# Patient Record
Sex: Male | Born: 1960
Health system: Southern US, Community
[De-identification: ages and names within clinical notes are randomized; demographics above are authoritative.]

---
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
## 1999-05-12 ENCOUNTER — Other Ambulatory Visit: Admission: RE | Admit: 1999-05-12 | Discharge: 1999-05-12 | Payer: Self-pay | Admitting: Urology

## 2009-07-18 ENCOUNTER — Ambulatory Visit: Payer: Self-pay | Admitting: Family Medicine

## 2009-07-19 ENCOUNTER — Encounter: Payer: Self-pay | Admitting: Family Medicine

## 2009-07-22 ENCOUNTER — Encounter: Payer: Self-pay | Admitting: Family Medicine

## 2009-07-22 LAB — CONVERTED CEMR LAB
HDL goal, serum: 40 mg/dL
LDL Goal: 160 mg/dL

## 2010-05-09 ENCOUNTER — Ambulatory Visit: Payer: Self-pay | Admitting: Family Medicine

## 2010-05-09 DIAGNOSIS — M5430 Sciatica, unspecified side: Secondary | ICD-10-CM

## 2010-05-15 ENCOUNTER — Encounter: Admission: RE | Admit: 2010-05-15 | Discharge: 2010-08-13 | Payer: Self-pay | Admitting: Family Medicine

## 2011-01-28 LAB — CONVERTED CEMR LAB
Cholesterol: 201 mg/dL
HDL: 46 mg/dL
LDL Cholesterol: 133 mg/dL
Triglyceride fasting, serum: 108 mg/dL

## 2011-01-30 NOTE — Assessment & Plan Note (Signed)
Summary: Piriformis Syndrome   Vital Signs:  Patient profile:   50 year old male Height:      69.5 inches Weight:      180 pounds BMI:     26.29 Pulse rate:   87 / minute BP sitting:   129 / 70  (left arm) Cuff size:   regular  Vitals Entered By: Kathlene November (May 09, 2010 8:25 AM) CC: left buttock and upper leg pain when goes from sitting to standing and lying for 3 months   Primary Care Provider:  Nani Gasser MD  CC:  left buttock and upper leg pain when goes from sitting to standing and lying for 3 months.  History of Present Illness: left buttock and upper leg pain when goes from sitting to standing and lying for 3 months.  Did get better for a short period of time but then started again about 3 weeks ago.  In mid-Dec was carrying some sound equipment and not sure if related but was heavy. Did have a short period where all his muscles felt sore but those got better.  Feels like an intense "muscle pain".  Hx of tight hamstrings.  No back pain. Sitting makes it worse. 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  Sitting in car for prolonged time worsens.  Better with ambulation.  Taking 400mg  of IBU in AM and PM.  Using Biofreeze.   No old injuries or surgery to this area.   Current Medications (verified): 1)  Aspirin 81 Mg Tbec (Aspirin) .... Take One Tablet By Mouth Once A Day  Allergies (verified): No Known Drug Allergies  Comments:  Nurse/Medical Assistant: The patient's medications and allergies were reviewed with the patient and were updated in the Medication and Allergy Lists. Kathlene November (May 09, 2010 8:26 AM)  Past History:  Past Surgical History: Last updated: 07/18/2009 Groin Hernia, right 1974 Vasectomy 1999 Tonsillectomy   Physical Exam  General:  Well-developed,well-nourished,in no acute distress; alert,appropriate and cooperative throughout examination Msk:  Decreased flexion secondary toe tight hamstrings. Normal extension, rotation right and left. and side bending.   Neg straight leg raise thought pain inthe buttock with theis. Hip, knee, and ankle strength 5/5 bilat. Pian with hip adduction Neurologic:  Patellar and achilles 1+ bilat.    Impression & Recommendations:  Problem # 1:  SCIATICA, ACUTE (ICD-724.3) Dsicussed likely piriformis syndrome.  Can use IBU 800mg  two times a day or can call in NSAID for him. H.O give on stretches and will refer to PT.  if not improving in 2-3 weeeks then rec f/u for further eval. I don't suspect any problems with his lumbar spine right now.   His updated medication list for this problem includes:    Aspirin 81 Mg Tbec (Aspirin) .Marland Kitchen... Take one tablet by mouth once a day    Diclofenac Sodium 75 Mg Tbec (Diclofenac sodium) .Marland Kitchen... Take 1 tablet by mouth two times a day as needed  Orders: Physical Therapy Referral (PT)  Complete Medication List: 1)  Aspirin 81 Mg Tbec (Aspirin) .... Take one tablet by mouth once a day 2)  Diclofenac Sodium 75 Mg Tbec (Diclofenac sodium) .... Take 1 tablet by mouth two times a day as needed  Patient Instructions: 1)  Can increase your Ibuprofen 800mg  two times a day for pain relief with food and water.  2)  We will refer you to physical therapy.   Prescriptions: DICLOFENAC SODIUM 75 MG TBEC (DICLOFENAC SODIUM) Take 1 tablet by mouth two times a day  as needed  #60 x 0   Entered and Authorized by:   Nani Gasser MD   Signed by:   Nani Gasser MD on 05/09/2010   Method used:   Electronically to        [facility] [phone]* (retail)       [address] [place], [place]  [address]       Ph: [phone] or [phone]       Fax: [phone]   RxID:   [id number]

## 2012-04-04 ENCOUNTER — Emergency Department
Admission: EM | Admit: 2012-04-04 | Discharge: 2012-04-04 | Disposition: A | Discharge status: Home / Self Care | Payer: BC Managed Care – PPO | Source: Home / Self Care | Attending: Emergency Medicine | Admitting: Emergency Medicine

## 2012-04-04 DIAGNOSIS — R197 Diarrhea, unspecified: Secondary | ICD-10-CM

## 2012-04-04 DIAGNOSIS — A09 Infectious gastroenteritis and colitis, unspecified: Secondary | ICD-10-CM

## 2012-04-04 DIAGNOSIS — A084 Viral intestinal infection, unspecified: Secondary | ICD-10-CM

## 2012-04-04 LAB — POCT CBC W AUTO DIFF (K'VILLE URGENT CARE)

## 2012-04-04 MED ORDER — IBUPROFEN 400 MG PO TABS
400.0000 mg | ORAL_TABLET | Freq: Once | ORAL | Status: AC
  Administered 2012-04-04: 400 mg via ORAL

## 2012-04-04 NOTE — ED Notes (Addendum)
Patient presents with fever and chills x 5 days. The first three days he had diarrhea which has resolved. Denies nausea, vomiting or sweats. He also complains of headaches and body aches. He has tried an OTC flu medication, ( homeopathic ) without relief of symptoms.

## 2012-04-04 NOTE — ED Provider Notes (Signed)
History     CSN: [id number]  Arrival date & time 04/04/12  1428   First MD Initiated Contact with Patient 04/04/12 1508      Chief Complaint  Patient presents with  . Fever    x 5 days  . Chills    x 5 days    (Consider location/radiation/quality/duration/timing/severity/associated sxs/prior treatment) Patient is a 51 y.o. male presenting with fever.  Fever Primary symptoms of the febrile illness include fever.   This patient presents with fever and chills for the last 5 days.  The first 3 days he had diarrhea which has since resolved.  He denies nausea vomiting.  He also complains of flulike symptoms including headache and body aches.  He has tried some over-the-counter flu medication without any relief.  His wife is a Engineer, civil (consulting) and is here with him.  They state that he was having a lot of diarrhea and they think he may be a little bit dehydrated from it.  He has not been very hungry over the last few days and has not eaten very much, however this morning he did drink some coffee and drinks a lot of fluids well.  He is feeling very weak and fatigued.  No known sick contacts however there are some people at his church who have a stomach virus.  History reviewed. No pertinent past medical history.  Past Surgical History  Procedure Date  . Hernia repair     as a child    Family History  Problem Relation Age of Onset  . Hypertension Father     History  Substance Use Topics  . Smoking status: Not on file  . Smokeless tobacco: Never Used  . Alcohol Use: No      Review of Systems  Constitutional: Positive for fever.  All other systems reviewed and are negative.    Allergies  Review of patient's allergies indicates no known allergies.  Home Medications   Current Outpatient Rx  Name Route Sig Dispense Refill  . IBUPROFEN 200 MG PO TABS Oral Take 200 mg by mouth every 6 (six) hours as needed.      BP 158/90  Pulse 112  Temp(Src) 98.7 F (37.1 C) (Oral)  Resp 16   Ht 5\' 9"  (1.753 m)  Wt 176 lb (79.833 kg)  BMI 25.99 kg/m2  SpO2 97%  Physical Exam  Nursing note and vitals reviewed. Constitutional: He is oriented to person, place, and time. He appears well-developed and well-nourished.  Non-toxic appearance. He appears distressed (fatigue).  HENT:  Head: Normocephalic and atraumatic.  Right Ear: Tympanic membrane, external ear and ear canal normal.  Left Ear: Tympanic membrane, external ear and ear canal normal.  Nose: Nose normal.  Mouth/Throat: Uvula is midline, oropharynx is clear and moist and mucous membranes are normal. Mucous membranes are not dry.  Eyes: No scleral icterus.  Neck: Normal range of motion and full passive range of motion without pain. Neck supple. No tracheal tenderness present. No rigidity.  Cardiovascular: Regular rhythm, normal heart sounds and normal pulses.  Tachycardia present.   Pulmonary/Chest: Effort normal and breath sounds normal. No accessory muscle usage. No respiratory distress. He has no decreased breath sounds. He has no wheezes. He has no rhonchi.  Abdominal: There is tenderness (Mild epigastric tenderness). There is no rigidity, no rebound, no guarding, no CVA tenderness, no tenderness at McBurney's point and negative Murphy's sign.  Neurological: He is alert and oriented to person, place, and time.  Skin: Skin is warm  and dry. No rash noted.  Psychiatric: He has a normal mood and affect. His speech is normal and behavior is normal. Thought content normal.    ED Course  Procedures (including critical care time)   Labs Reviewed  POCT INFLUENZA A/B - Normal   No results found.   1. Diarrhea   2. Viral gastroenteritis       MDM   A rapid flu test was done today in clinic and was negative.  A CBC was done which showed a white count of 10 with a differential of 15% lymphocytes, 5% monocytes, and granulocytes slightly high at 80%.  Hemoglobin is 15.2 and platelets are 189.  We decided that the best  treatment right now would be to replace her fluids and gave him an IV with normal saline and gave him 1 L of fluid.  After that was finished we continued it for another 500 cc.  I advised the wife to make sure that she is pushing fluids and a bland diet on him for the next few days.  It is possible that he may have to do a liquid diet for a few days and then gradually increase it.  This is likely a viral gastroenteritis.  The diarrhea has resolved but if it becomes a nuisance than Imodium may be appropriate.  ER precautions are given for worsening pain, fever, worsening symptoms, or oral intolerance.  Upon leaving the patient looks better but still has a headache.  Give him ibuprofen why he is here.  Marlaine Hind, MD 04/04/12 1650

## 2012-04-06 ENCOUNTER — Telehealth: Payer: Self-pay | Admitting: Family Medicine

## 2016-04-24 ENCOUNTER — Encounter: Payer: Self-pay | Admitting: Family Medicine

## 2016-04-24 ENCOUNTER — Ambulatory Visit (INDEPENDENT_AMBULATORY_CARE_PROVIDER_SITE_OTHER): Payer: BLUE CROSS/BLUE SHIELD | Admitting: Family Medicine

## 2016-04-24 VITALS — BP 127/68 | HR 87 | Ht 68.5 in | Wt 179.0 lb

## 2016-04-24 DIAGNOSIS — H9313 Tinnitus, bilateral: Secondary | ICD-10-CM | POA: Diagnosis not present

## 2016-04-24 DIAGNOSIS — Z114 Encounter for screening for human immunodeficiency virus [HIV]: Secondary | ICD-10-CM | POA: Diagnosis not present

## 2016-04-24 DIAGNOSIS — Z1159 Encounter for screening for other viral diseases: Secondary | ICD-10-CM | POA: Diagnosis not present

## 2016-04-24 DIAGNOSIS — Z0000 Encounter for general adult medical examination without abnormal findings: Secondary | ICD-10-CM

## 2016-04-24 DIAGNOSIS — H9319 Tinnitus, unspecified ear: Secondary | ICD-10-CM | POA: Insufficient documentation

## 2016-04-24 NOTE — Patient Instructions (Addendum)
Recommend colon cancer screening starting at age 54. Consider colonoscopy versus sigmoidoscopy versus: Card versus yearly stool card occult testing. For vaccinations recommendation would be for a tetanus with it. And pertussis vaccination. This is a combination vaccine and is recommended every 10 years. Shingles vaccine is also recommended for all patients over 50. The you need to check with your insurance as some won't improve it and let sure 60 or over. Consider seeing ENT for the ear ringing and abnormal ear exam today. There is a group ear locally call [facility]. New guidelines recommend regular moderate intensity exercise for at least 30 minutes 5 days per week.

## 2016-04-24 NOTE — Progress Notes (Addendum)
Subjective:    Patient ID: Norman Rivera, male    DOB: 1961/12/05, 55 y.o.   MRN: [id number]  HPI 55 year old male who is here today for complete physical exam. He currently takes no prescription medications.   He does complain of ear ringing. It's been going on for well over a year maybe even a couple of years. He says it's been very gradual. Though it's been a little bit worse over the last 2 weeks since walking and was son's room who was playing drums. 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 He denies any injury or trauma. He does get hearing tested yearly through work but hasn't had his test in almost 2 years.   Review of Systems  Constitutional: Negative for fever, diaphoresis and unexpected weight change.  HENT: Positive for tinnitus. Negative for hearing loss, rhinorrhea and sneezing.   Eyes: Negative for visual disturbance.  Respiratory: Negative for cough and wheezing.   Cardiovascular: Negative for chest pain and palpitations.  Gastrointestinal: Negative for nausea, vomiting, diarrhea and blood in stool.  Genitourinary: Negative for dysuria and discharge.  Musculoskeletal: Negative for myalgias and arthralgias.  Skin: Negative for rash.  Neurological: Negative for headaches.  Hematological: Negative for adenopathy.  Psychiatric/Behavioral: Negative for sleep disturbance and dysphoric mood. The patient is not nervous/anxious.     BP 127/68 mmHg  Pulse 87  Ht 5' 8.5" (1.74 m)  Wt 179 lb (81.194 kg)  BMI 26.82 kg/m2  SpO2 100%    No Known Allergies  No past medical history on file.  Past Surgical History  Procedure Laterality Date  . Hernia repair  1974    as a child    Social History   Social History  . Marital Status: Married    Spouse Name: [name]   . Number of Children: 3  . Years of Education: BS   Occupational History  . inventory Hydrologist metals    Social History Main Topics  . Smoking status: Never Smoker   . Smokeless tobacco: Never Used  . Alcohol Use: No  . Drug Use: No  . Sexual Activity:    Partners: Female   Other Topics Concern  . 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 Married to [place]. 2 adult sons.    Family History  Problem Relation Age of Onset  . Hypertension Father     Outpatient Encounter Prescriptions as of 04/24/2016  Medication Sig  . ibuprofen (ADVIL,MOTRIN) 200 MG tablet Take 200 mg by mouth every 6 (six) hours as needed.   No facility-administered encounter medications on file as of 04/24/2016.          Objective:   Physical Exam  Constitutional: He is oriented to person, place, and time. He appears well-developed and well-nourished.  HENT:  Head: Normocephalic and atraumatic.  Right Ear: External ear normal.  Left Ear: External ear normal.  Nose: Nose normal.  Mouth/Throat: Oropharynx is clear and moist.  Eyes: Conjunctivae and EOM are normal. Pupils are equal, round, and reactive to light.  Neck: Normal range of motion. Neck supple. No thyromegaly present.  Cardiovascular: Normal rate, regular rhythm, normal heart sounds and intact distal pulses.   Pulmonary/Chest: Effort normal and breath sounds normal.  Abdominal: Soft. Bowel sounds are normal. He exhibits no distension  and no mass. There is no tenderness. There is no rebound and no guarding.  Musculoskeletal: Normal range of motion.  Lymphadenopathy:    He has no cervical adenopathy.  Neurological: He is alert and oriented to person, place, and time. He has normal reflexes.  Skin: Skin is warm and dry.  Psychiatric: He has a normal mood and affect. His behavior is normal. Judgment and thought content normal.       Assessment & Plan:  CPE Keep up a regular exercise program and make sure you are eating a healthy diet Try to eat 4 servings of dairy a day, or if  you are lactose intolerant take a calcium with vitamin D daily.  Discussed need for tetanus vaccine.Declined today but will think about it. Discussed need for shingles vaccine. Declined today but will think about it. Discussed colon cancer screening options.   He says he wants to think about it.    Tinnitus - Discussed options. Because he does have slightly abnormal ear exam I recommend ENT referral for further evaluation. He says he wants to think about it.

## 2016-04-25 ENCOUNTER — Encounter: Payer: Self-pay | Admitting: Family Medicine

## 2016-04-25 LAB — LIPID PANEL
CHOL/HDL RATIO: 4 ratio (ref <=5.0)
Cholesterol: 181 mg/dL (ref 125–200)
HDL: 45 mg/dL (ref >=40)
LDL CALC: 111 mg/dL (ref <130)
TRIGLYCERIDES: 127 mg/dL (ref <150)
VLDL: 25 mg/dL (ref <30)

## 2016-04-25 LAB — COMPLETE METABOLIC PANEL WITH GFR
ALT: 17 U/L (ref 9–46)
AST: 13 U/L (ref 10–35)
Albumin: 4.3 g/dL (ref 3.6–5.1)
Alkaline Phosphatase: 93 U/L (ref 40–115)
BUN: 13 mg/dL (ref 7–25)
CHLORIDE: 102 mmol/L (ref 98–110)
CO2: 25 mmol/L (ref 20–31)
Calcium: 9.3 mg/dL (ref 8.6–10.3)
Creat: 0.91 mg/dL (ref 0.70–1.33)
GFR, Est African American: >89 mL/min (ref >=60)
GLUCOSE: 84 mg/dL (ref 65–99)
POTASSIUM: 4.2 mmol/L (ref 3.5–5.3)
SODIUM: 139 mmol/L (ref 135–146)
Total Bilirubin: 0.9 mg/dL (ref 0.2–1.2)
Total Protein: 6.9 g/dL (ref 6.1–8.1)

## 2016-04-25 NOTE — Addendum Note (Signed)
Addended by: Nani GasserMETHENEY, Aidenjames Heckmann D on: 04/25/2016 03:07 PM   Modules accepted: Level of Service, SmartSet

## 2016-04-26 LAB — HIV ANTIBODY (ROUTINE TESTING W REFLEX): HIV: NONREACTIVE

## 2016-04-26 LAB — HEPATITIS C ANTIBODY: HCV Ab: NEGATIVE

## 2017-06-24 ENCOUNTER — Encounter: Payer: Self-pay | Admitting: Family Medicine

## 2017-06-24 ENCOUNTER — Ambulatory Visit (INDEPENDENT_AMBULATORY_CARE_PROVIDER_SITE_OTHER): Payer: BLUE CROSS/BLUE SHIELD | Admitting: Family Medicine

## 2017-06-24 VITALS — BP 130/80 | HR 83 | Ht 68.5 in | Wt 176.0 lb

## 2017-06-24 DIAGNOSIS — T63441A Toxic effect of venom of bees, accidental (unintentional), initial encounter: Secondary | ICD-10-CM

## 2017-06-24 DIAGNOSIS — M25471 Effusion, right ankle: Secondary | ICD-10-CM

## 2017-06-24 DIAGNOSIS — R2231 Localized swelling, mass and lump, right upper limb: Secondary | ICD-10-CM

## 2017-06-24 MED ORDER — PREDNISONE 20 MG PO TABS: 40.0000 mg | ORAL_TABLET | Freq: Every day | ORAL | 0 refills | Status: DC

## 2017-06-24 NOTE — Patient Instructions (Addendum)
Anaphylactic Reaction An anaphylactic reaction (anaphylaxis) is a sudden allergic reaction that is very bad (severe). It also affects more than one part of the body. This condition can be life-threatening. If you have an anaphylactic reaction, you need to get medical help right away. You may need to stay in the hospital. Your doctor may teach you how to use an allergy kit (anaphylaxis kit) and how to give yourself an allergy shot (epinephrine injection). You can give yourself an allergy shot with what is commonly called an auto-injector "pen." Symptoms of an anaphylactic reaction may include:  A stuffy nose (nasal congestion).  Headache.  Tingling in your mouth.  A flushed face.  An itchy, red rash.  Swelling of your eyes, lips, face, or tongue.  Swelling of the back of your mouth and your throat.  Breathing loudly (wheezing).  A hoarse voice.  Itchy, red, swollen areas of skin (hives).  Dizziness or light-headedness.  Passing out (fainting).  Feeling worried or nervous (anxiety).  Feeling confused.  Pain in your belly (abdomen) or chest.  Trouble with breathing, talking, or swallowing.  A tight feeling in your chest or throat.  Fast or uneven heartbeats (palpitations).  Throwing up (vomiting).  Watery poop (diarrhea).  Follow these instructions at home: Safety  Always keep an auto-injector pen or your allergy kit with you. These could save your life. Use them as told by your doctor.  Do not drive until your doctor says that it is safe.  Make sure that you, the people who live with you, and your employer know: ? How to use your allergy kit. ? How to use an auto-injector pen to give you an allergy shot.  If you used your auto-injector pen: ? Get more medicine for it right away. This is important in case you have another reaction. ? Get help right away.  Wear a bracelet or necklace that says you have an allergy, if your doctor tells you to do this.  Learn  the signs of a very bad allergic reaction.  Work with your doctors to make a plan for what to do if you have a very bad allergic reaction. Being prepared is important. General instructions  Take over-the-counter and prescription medicines only as told by your doctor.  If you have itchy, red, swollen areas of skin or a rash: ? Use over-the-counter medicine (antihistamine) as told by your doctor. ? Put cold, wet cloths (cold compresses) on your skin. ? Take baths or showers in cool water. Avoid hot water.  If you had tests done, it is up to you to get your test results. Ask your doctor when your results will be ready.  Tell any doctors who care for you that you have an allergy.  Keep all follow-up visits as told by your doctor. This is important. How is this prevented?  Avoid things (allergens) that gave you a very bad allergic reaction before.  If you have a food allergy and you go to a restaurant, tell your server about your allergy. If you are not sure if your meal was made with food that you are allergic to, ask your server before you eat it. Contact a doctor if:  You have symptoms of an allergic reaction. You may notice them soon after being around whatever it is that you are allergic to. Symptoms may include: ? A rash. ? A headache. ? Sneezing or a runny nose. ? Swelling. ? Feeling sick to your stomach. ? Watery poop. Get help right   away if:  You had to use your auto-injector pen. You must go to the emergency room even if the medicine seems to be working.  You have any of these: ? A tight feeling in your chest or your throat. ? Loud breathing. ? Trouble with breathing. ? Itchy, red, swollen areas of skin. ? Red skin or itching all over your body. ? Swelling in your lips, tongue, or the back of your throat.  You have throwing up that gets very bad.  You have watery poop that gets very bad.  You pass out or feel like you might pass out. These symptoms may be an  emergency. Do not wait to see if the symptoms will go away. Use your auto-injector pen or allergy kit as you have been told. Get medical help right away. Call your local emergency services (911 in the [place] Do not drive yourself to the hospital. Summary  An anaphylactic reaction (anaphylaxis) is a sudden allergic reaction that is very bad (severe).  This condition can be life-threatening. If you have an anaphylactic reaction, you need to get medical help right away.  Your doctor may teach you how to use an allergy kit (anaphylaxis kit) and how to give yourself an allergy shot (epinephrine injection) with an auto-injector "pen."  Always keep an auto-injector pen or your allergy kit with you. These could save your life. Use them as told by your doctor.  If you had to use your auto-injector pen, you must go to the emergency room even if the medicine seems to be working. This information is not intended to replace advice given to you by your health care provider. Make sure you discuss any questions you have with your health care provider. Document Released: 06/04/2008 Document Revised: 08/10/2016 Document Reviewed: 08/10/2016 Elsevier Interactive Patient Education  2017 Elsevier Inc.  

## 2017-06-24 NOTE — Progress Notes (Signed)
CC: Multiple Bee Stings  Subjective:    Patient ID: Norman Rivera, male    DOB: [birth year], 56 y.o.   MRN: [id number]  HPI 56 yo male comes in today after being stung by yellow jacket yesterday. Stung on the right hand and on right ankle while mowing the grass.  Says a year or so ago was stung about 12 times and didn't have any significant reaction.  This time he says he started swelling in the local area of the sting and then the redness and swelling has spread up to his forearm almost up to the elbow. On the ankle it has spread up to about the mid tibia area. He does have significant swelling around the ankle. He has not had any fever she'll shortness of breath. No nasal congestion. No cough or voice hoarseness. He's been using some topical calamine lotion and taking oral Benadryl.   Review of Systems   BP 130/80   Pulse 83   Ht 5' 8.5" (1.74 m)   Wt 176 lb (79.8 kg)   BMI 26.37 kg/m     No Known Allergies  No past medical history on file.  Past Surgical History:  Procedure Laterality Date  . HERNIA REPAIR  1974   as a child    Social History   Social History  . Marital status: Married    Spouse name: [name]   . Number of children: 3  . Years of education: BS   Occupational History  . inventory Hydrologist metals   Social History Main Topics  . Smoking status: Never Smoker  . Smokeless tobacco: Never Used  . Alcohol use No  . Drug use: No  . Sexual activity: Yes    Partners: Female   Other Topics Concern  . 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 Married to [place]. 2 adult sons.    Family History  Problem Relation Age of Onset  . Hypertension Father     Outpatient Encounter Prescriptions as of 06/24/2017  Medication Sig  . ibuprofen (ADVIL,MOTRIN) 200 MG tablet Take 200 mg by mouth every 6 (six) hours as needed.  . predniSONE (DELTASONE) 20 MG tablet Take 2 tablets (40 mg total) by  mouth daily.   No facility-administered encounter medications on file as of 06/24/2017.           Objective:   Physical Exam  Constitutional: He is oriented to person, place, and time. He appears well-developed and well-nourished.  HENT:  Head: Normocephalic and atraumatic.  Cardiovascular: Normal rate, regular rhythm and normal heart sounds.   Pulmonary/Chest: Effort normal and breath sounds normal.  Neurological: He is alert and oriented to person, place, and time.  Skin: Skin is warm and dry.  She has significant pitting edema and swelling on the right hand along the fingers and dorsum of the hand. He has swelling almost up to his elbow. Increased warmth over the area as well. Right ankle and lower leg are swollen as well. Increased warmth and some mild erythema around the ankle.  Psychiatric: He has a normal mood and affect. His behavior is normal.          Assessment & Plan:  Insect Stings - Will give oral prednisone for 5 days to help with the swelling and discomfort. Recommend icing frequently over the next several days. If not improving them please let me know. At this point  he has had a very significant local reaction. Try to avoid a future stings if at all possible. I gave him a list of warning signs and symptoms for anaphylaxis and if any of these occur and consider giving him an EpiPen.

## 2017-09-06 ENCOUNTER — Ambulatory Visit (INDEPENDENT_AMBULATORY_CARE_PROVIDER_SITE_OTHER): Payer: BLUE CROSS/BLUE SHIELD | Admitting: Family Medicine

## 2017-09-06 ENCOUNTER — Encounter: Payer: Self-pay | Admitting: Family Medicine

## 2017-09-06 VITALS — BP 140/68 | HR 92 | Wt 172.0 lb

## 2017-09-06 DIAGNOSIS — R002 Palpitations: Secondary | ICD-10-CM | POA: Diagnosis not present

## 2017-09-06 NOTE — Patient Instructions (Signed)
I would recommend an echocardiogram and a heart monitor to "catch" the sensations and see if they are early beats or something else.

## 2017-09-06 NOTE — Progress Notes (Signed)
Subjective:    Patient ID: Norman Rivera, male    DOB: 1961-05-13, 56 y.o.   MRN: [id number]  HPI  56 yo male comes in complaining of fluttering in his chest. He feels it is stress related.  He says is been going for at least 6 weeks. He says it almost feels like a "sinking". It just lasts a few seconds. Or maybe even a skipped beat. He notices it happens more when he is feeling very stressed. He has not noticed it during exercise. He typically does yoga about twice a week. He says his job is very stressful right now. He does usually drink coffee in the mornings but says he drinks decaffeinated soda at lunch. He denies any recent change in caffeine intake. No other medications or decongestants. Father has a history of peripheral vascular disease but no coronary artery disease that is known.  Review of Systems  BP 140/68   Pulse 92   Wt 172 lb (78 kg)   SpO2 99%   BMI 25.77 kg/m     No Known Allergies  No past medical history on file.  Past Surgical History:  Procedure Laterality Date  . HERNIA REPAIR  1974   as a child    Social History   Social History  . Marital status: Married    Spouse name: [name]   . Number of children: 3  . Years of education: BS   Occupational History  . inventory Hydrologist metals   Social History Main Topics  . Smoking status: Never Smoker  . Smokeless tobacco: Never Used  . Alcohol use No  . Drug use: No  . Sexual activity: Yes    Partners: Female   Other Topics Concern  . 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 Married to [place]. 2 adult sons.    Family History  Problem Relation Age of Onset  . Hypertension Father   . Peripheral vascular disease Father     Outpatient Encounter Prescriptions as of 09/06/2017  Medication Sig  . [DISCONTINUED] ibuprofen (ADVIL,MOTRIN) 200 MG tablet Take 200 mg by mouth every 6 (six) hours as needed.  . [DISCONTINUED]  predniSONE (DELTASONE) 20 MG tablet Take 2 tablets (40 mg total) by mouth daily.   No facility-administered encounter medications on file as of 09/06/2017.          Objective:   Physical Exam  Constitutional: He is oriented to person, place, and time. He appears well-developed and well-nourished.  HENT:  Head: Normocephalic and atraumatic.  Neck: Neck supple. No thyromegaly present.  Cardiovascular: Normal rate, regular rhythm and normal heart sounds.   No carotid bruits or renal bruits.   Pulmonary/Chest: Effort normal and breath sounds normal.  Musculoskeletal: He exhibits no edema.  Lymphadenopathy:    He has no cervical adenopathy.  Neurological: He is alert and oriented to person, place, and time.  Skin: Skin is warm and dry.  Psychiatric: He has a normal mood and affect. His behavior is normal.        Assessment & Plan:  Palpitations- overall low risk for CAD.  No HTN, non smoker, no early fam hx.  Lipids borderline in the past. Gave reasuurance. Will eval for thyroid d/o, anemia, and electrolite imbalance.  Avoid caffein. Work on getting adequate sleep. Work on reducing stress.  Discussed additional work up with echo and heart monitor. He is worried about  his deductible but will discuss with his wife and get back with me.    EKG today shows rate of   , NSR with        .

## 2017-09-07 LAB — COMPLETE METABOLIC PANEL WITH GFR
AG Ratio: 2 (calc) (ref 1.0–2.5)
ALBUMIN MSPROF: 4.7 g/dL (ref 3.6–5.1)
ALT: 13 U/L (ref 9–46)
AST: 13 U/L (ref 10–35)
Alkaline phosphatase (APISO): 95 U/L (ref 40–115)
BUN: 11 mg/dL (ref 7–25)
CALCIUM: 9.7 mg/dL (ref 8.6–10.3)
CO2: 31 mmol/L (ref 20–32)
CREATININE: 0.89 mg/dL (ref 0.70–1.33)
Chloride: 100 mmol/L (ref 98–110)
GFR, EST AFRICAN AMERICAN: 112 mL/min/{1.73_m2} (ref >=60)
GFR, EST NON AFRICAN AMERICAN: 96 mL/min/{1.73_m2} (ref >=60)
GLOBULIN: 2.4 g/dL (ref 1.9–3.7)
Glucose, Bld: 79 mg/dL (ref 65–99)
Potassium: 4.3 mmol/L (ref 3.5–5.3)
SODIUM: 138 mmol/L (ref 135–146)
TOTAL PROTEIN: 7.1 g/dL (ref 6.1–8.1)
Total Bilirubin: 0.8 mg/dL (ref 0.2–1.2)

## 2017-09-07 LAB — CBC WITH DIFFERENTIAL/PLATELET
Basophils Absolute: 61 cells/uL (ref 0–200)
Basophils Relative: 0.9 %
EOS ABS: 82 {cells}/uL (ref 15–500)
Eosinophils Relative: 1.2 %
HCT: 45.9 % (ref 38.5–50.0)
Hemoglobin: 15.6 g/dL (ref 13.2–17.1)
Lymphs Abs: 1809 cells/uL (ref 850–3900)
MCH: 29.7 pg (ref 27.0–33.0)
MCHC: 34 g/dL (ref 32.0–36.0)
MCV: 87.3 fL (ref 80.0–100.0)
MONOS PCT: 7.2 %
MPV: 12 fL (ref 7.5–12.5)
Neutro Abs: 4359 cells/uL (ref 1500–7800)
Neutrophils Relative %: 64.1 %
PLATELETS: 229 10*3/uL (ref 140–400)
RBC: 5.26 10*6/uL (ref 4.20–5.80)
RDW: 12.8 % (ref 11.0–15.0)
TOTAL LYMPHOCYTE: 26.6 %
WBC mixed population: 490 cells/uL (ref 200–950)
WBC: 6.8 10*3/uL (ref 3.8–10.8)

## 2017-09-07 LAB — TSH: TSH: 2.75 m[IU]/L (ref 0.40–4.50)

## 2017-10-14 ENCOUNTER — Ambulatory Visit (INDEPENDENT_AMBULATORY_CARE_PROVIDER_SITE_OTHER): Payer: BLUE CROSS/BLUE SHIELD | Admitting: Family Medicine

## 2017-10-14 ENCOUNTER — Encounter: Payer: Self-pay | Admitting: Family Medicine

## 2017-10-14 VITALS — BP 117/65 | HR 69 | Ht 69.0 in | Wt 175.0 lb

## 2017-10-14 DIAGNOSIS — Z0000 Encounter for general adult medical examination without abnormal findings: Secondary | ICD-10-CM

## 2017-10-14 LAB — COMPLETE METABOLIC PANEL WITH GFR
AG RATIO: 2 (calc) (ref 1.0–2.5)
ALT: 13 U/L (ref 9–46)
AST: 15 U/L (ref 10–35)
Albumin: 4.7 g/dL (ref 3.6–5.1)
Alkaline phosphatase (APISO): 96 U/L (ref 40–115)
BILIRUBIN TOTAL: 1.4 mg/dL — AB (ref 0.2–1.2)
BUN: 10 mg/dL (ref 7–25)
CALCIUM: 9.6 mg/dL (ref 8.6–10.3)
CHLORIDE: 101 mmol/L (ref 98–110)
CO2: 30 mmol/L (ref 20–32)
Creat: 1.03 mg/dL (ref 0.70–1.33)
GFR, EST NON AFRICAN AMERICAN: 81 mL/min/{1.73_m2} (ref >=60)
GFR, Est African American: 94 mL/min/{1.73_m2} (ref >=60)
GLUCOSE: 89 mg/dL (ref 65–99)
Globulin: 2.3 g/dL (calc) (ref 1.9–3.7)
POTASSIUM: 4.2 mmol/L (ref 3.5–5.3)
Sodium: 137 mmol/L (ref 135–146)
TOTAL PROTEIN: 7 g/dL (ref 6.1–8.1)

## 2017-10-14 LAB — LIPID PANEL W/REFLEX DIRECT LDL
Cholesterol: 183 mg/dL (ref <200)
HDL: 52 mg/dL (ref >40)
LDL CHOLESTEROL (CALC): 114 mg/dL — AB
Non-HDL Cholesterol (Calc): 131 mg/dL (calc) — ABNORMAL HIGH (ref <130)
TRIGLYCERIDES: 77 mg/dL (ref <150)
Total CHOL/HDL Ratio: 3.5 (calc) (ref <5.0)

## 2017-10-14 LAB — PSA: PSA: 0.5 ng/mL (ref <=4.0)

## 2017-10-14 NOTE — Progress Notes (Signed)
Subjective:    Patient ID: Norman Rivera, male    DOB: 06-15-1961, 56 y.o.   MRN: [id number]  HPI 56 year old male is here today for complete physical exam. He was recently seen for palpitations about 6 weeks ago. Still having occasional palpitations but hasn't checked with his insurance yet to see if they will cover a cardiac monitor and possible echocardiogram. He says he will let me know.  Also c/o of bilat knee pain - he has been getting some medial knee pain. He's been doing yoga over the last 9 months and says occasionally if he steps outward with his knee has noticed some pain or discomfort. Doesn't really keep him from doing specific activities. He denies any swelling or redness over the joints. He has had some arthritis in his shoulder previously.  Review of Systems  Comprehensive review systems is negative.    BP 117/65   Pulse 69   Ht  (1.753 m)   Wt 175 lb (79.4 kg)   SpO2 100%   BMI 25.84 kg/m     No Known Allergies  No past medical history on file.  Past Surgical History:  Procedure Laterality Date  . HERNIA REPAIR  1974   as a child    Social History   Social History  . Marital status: Married    Spouse name: [name]   . Number of children: 3  . Years of education: BS   Occupational History  . inventory Hydrologist metals   Social History Main Topics  . Smoking status: Never Smoker  . Smokeless tobacco: Never Used  . Alcohol use No  . Drug use: No  . Sexual activity: Yes    Partners: Female   Other Topics Concern  . 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 Married to [place]. 2 adult sons.  Doing Hot Yoga 3 days per week.      Family History  Problem Relation Age of Onset  . Hypertension Father   . Peripheral vascular disease Father     No outpatient encounter prescriptions on file as of 10/14/2017.   No facility-administered encounter medications on file as of  10/14/2017.           Objective:   Physical Exam  Constitutional: He is oriented to person, place, and time. He appears well-developed and well-nourished.  HENT:  Head: Normocephalic and atraumatic.  Right Ear: External ear normal.  Left Ear: External ear normal.  Nose: Nose normal.  Mouth/Throat: Oropharynx is clear and moist.  Eyes: Pupils are equal, round, and reactive to light. Conjunctivae and EOM are normal.  Neck: Normal range of motion. Neck supple. No thyromegaly present.  Cardiovascular: Normal rate, regular rhythm, normal heart sounds and intact distal pulses.   Pulmonary/Chest: Effort normal and breath sounds normal.  Abdominal: Soft. Bowel sounds are normal. He exhibits no distension and no mass. There is no tenderness. There is no rebound and no guarding.  Musculoskeletal: Normal range of motion.  Knee with normal flexion and extension. Tender over the medial joint lines bilaterally.  No swelling or edema.    Lymphadenopathy:    He has no cervical adenopathy.  Neurological: He is alert and oriented to person, place, and time. He has normal reflexes.  Skin: Skin is warm and dry.  Psychiatric: He has a normal mood and affect. His behavior is normal. Judgment and thought content normal.  Assessment & Plan:  CPE Keep up a regular exercise program and make sure you are eating a healthy diet Try to eat 4 servings of dairy a day, or if you are lactose intolerant take a calcium with vitamin D daily.  Your vaccines are up to date.   Bilat knee pain - Likely osteoarthritis versus cartilage damage. At this point just encourage him to make sure he is watching the position of his knee in the graft going past his toes when he is exercising. If it gets worse or more bothersome or is interfering with activities are keeping him from exercising and consider further workup including knee x-rays.  Discussed Colon Ca screening and advanced directives.

## 2017-10-14 NOTE — Patient Instructions (Addendum)
Check with your insurance on the coverage on colonoscopy.      Health Maintenance, Male A healthy lifestyle and preventive care is important for your health and wellness. Ask your health care provider about what schedule of regular examinations is right for you. What should I know about weight and diet? Eat a Healthy Diet  Eat plenty of vegetables, fruits, whole grains, low-fat dairy products, and lean protein.  Do not eat a lot of foods high in solid fats, added sugars, or salt.  Maintain a Healthy Weight Regular exercise can help you achieve or maintain a healthy weight. You should:  Do at least 150 minutes of exercise each week. The exercise should increase your heart rate and make you sweat (moderate-intensity exercise).  Do strength-training exercises at least twice a week.  Watch Your Levels of Cholesterol and Blood Lipids  Have your blood tested for lipids and cholesterol every 5 years starting at 55 years of age. If you are at high risk for heart disease, you should start having your blood tested when you are 55 years old. You may need to have your cholesterol levels checked more often if: ? Your lipid or cholesterol levels are high. ? You are older than 55 years of age. ? You are at high risk for heart disease.  What should I know about cancer screening? Many types of cancers can be detected early and may often be prevented. Lung Cancer  You should be screened every year for lung cancer if: ? You are a current smoker who has smoked for at least 30 years. ? You are a former smoker who has quit within the past 15 years.  Talk to your health care provider about your screening options, when you should start screening, and how often you should be screened.  Colorectal Cancer  Routine colorectal cancer screening usually begins at 55 years of age and should be repeated every 5-10 years until you are 55 years old. You may need to be screened more often if early forms of  precancerous polyps or small growths are found. Your health care provider may recommend screening at an earlier age if you have risk factors for colon cancer.  Your health care provider may recommend using home test kits to check for hidden blood in the stool.  A small camera at the end of a tube can be used to examine your colon (sigmoidoscopy or colonoscopy). This checks for the earliest forms of colorectal cancer.  Prostate and Testicular Cancer  Depending on your age and overall health, your health care provider may do certain tests to screen for prostate and testicular cancer.  Talk to your health care provider about any symptoms or concerns you have about testicular or prostate cancer.  Skin Cancer  Check your skin from head to toe regularly.  Tell your health care provider about any new moles or changes in moles, especially if: ? There is a change in a mole's size, shape, or color. ? You have a mole that is larger than a pencil eraser.  Always use sunscreen. Apply sunscreen liberally and repeat throughout the day.  Protect yourself by wearing long sleeves, pants, a wide-brimmed hat, and sunglasses when outside.  What should I know about heart disease, diabetes, and high blood pressure?  If you are 107-25 years of age, have your blood pressure checked every 3-5 years. If you are 54 years of age or older, have your blood pressure checked every year. You should have your blood  pressure measured twice-once when you are at a hospital or clinic, and once when you are not at a hospital or clinic. Record the average of the two measurements. To check your blood pressure when you are not at a hospital or clinic, you can use: ? An automated blood pressure machine at a pharmacy. ? A home blood pressure monitor.  Talk to your health care provider about your target blood pressure.  If you are between 18-40 years old, ask your health care provider if you should take aspirin to prevent heart  disease.  Have regular diabetes screenings by checking your fasting blood sugar level. ? If you are at a normal weight and have a low risk for diabetes, have this test once every three years after the age of 40. ? If you are overweight and have a high risk for diabetes, consider being tested at a younger age or more often.  A one-time screening for abdominal aortic aneurysm (AAA) by ultrasound is recommended for men aged 19-75 years who are current or former smokers. What should I know about preventing infection? Hepatitis B If you have a higher risk for hepatitis B, you should be screened for this virus. Talk with your health care provider to find out if you are at risk for hepatitis B infection. Hepatitis C Blood testing is recommended for:  Everyone born from [birth year] through 1964.  Anyone with known risk factors for hepatitis C.  Sexually Transmitted Diseases (STDs)  You should be screened each year for STDs including gonorrhea and chlamydia if: ? You are sexually active and are younger than 55 years of age. ? You are older than 55 years of age and your health care provider tells you that you are at risk for this type of infection. ? Your sexual activity has changed since you were last screened and you are at an increased risk for chlamydia or gonorrhea. Ask your health care provider if you are at risk.  Talk with your health care provider about whether you are at high risk of being infected with HIV. Your health care provider may recommend a prescription medicine to help prevent HIV infection.  What else can I do?  Schedule regular health, dental, and eye exams.  Stay current with your vaccines (immunizations).  Do not use any tobacco products, such as cigarettes, chewing tobacco, and e-cigarettes. If you need help quitting, ask your health care provider.  Limit alcohol intake to no more than 2 drinks per day. One drink equals 12 ounces of beer, 5 ounces of wine, or 1 ounces of  hard liquor.  Do not use street drugs.  Do not share needles.  Ask your health care provider for help if you need support or information about quitting drugs.  Tell your health care provider if you often feel depressed.  Tell your health care provider if you have ever been abused or do not feel safe at home. This information is not intended to replace advice given to you by your health care provider. Make sure you discuss any questions you have with your health care provider. Document Released: 06/14/2008 Document Revised: 08/15/2016 Document Reviewed: 09/20/2015 Elsevier Interactive Patient Education  [employer].

## 2018-06-10 ENCOUNTER — Ambulatory Visit: Payer: BLUE CROSS/BLUE SHIELD | Admitting: Family Medicine

## 2018-06-10 ENCOUNTER — Encounter: Payer: Self-pay | Admitting: Family Medicine

## 2018-06-10 ENCOUNTER — Ambulatory Visit (INDEPENDENT_AMBULATORY_CARE_PROVIDER_SITE_OTHER): Payer: BLUE CROSS/BLUE SHIELD

## 2018-06-10 DIAGNOSIS — M1711 Unilateral primary osteoarthritis, right knee: Secondary | ICD-10-CM | POA: Diagnosis not present

## 2018-06-10 DIAGNOSIS — M1712 Unilateral primary osteoarthritis, left knee: Secondary | ICD-10-CM

## 2018-06-10 DIAGNOSIS — M25562 Pain in left knee: Secondary | ICD-10-CM

## 2018-06-10 DIAGNOSIS — Z23 Encounter for immunization: Secondary | ICD-10-CM

## 2018-06-10 DIAGNOSIS — M25561 Pain in right knee: Secondary | ICD-10-CM

## 2018-06-10 MED ORDER — DICLOFENAC SODIUM 1 % TD GEL: 4.0000 g | Freq: Four times a day (QID) | TRANSDERMAL | 11 refills | Status: DC

## 2018-06-10 NOTE — Addendum Note (Signed)
Addended by: Pixie CasinoUNNINGHAM, Sanaz Scarlett C on: 06/10/2018 09:49 AM   Modules accepted: Orders

## 2018-06-10 NOTE — Progress Notes (Signed)
Subjective:    I'm seeing this patient as a consultation for:  Norman Rivera, Catherine D, MD   CC: left knee pain  HPI: Norman Rivera notes left knee pain.  This is been ongoing mildly for about 3 years but over the last year and a half is been worsening when he started doing hot yoga.  He notes about 3 months ago he really pushed his knee range of motion doing hot yoga session and notes since then he has had worsening pain and stiffness in his left knee.  He denies any particular injury or incident.  He notes pain at the medial aspect of the knee and inability to fully flex his knee compared to the contralateral right side.  He has some pain with ambulation and prolonged walking he has some pain in the morning when he wakes up after sleeping.  He denies significant locking or catching or giving way.  He is tried some over-the-counter medications he notes although the pain does not interfere with his ability to work it does interfere with his ability to do yoga and exercise normally.  Past medical history, Surgical history, Family history not pertinant except as noted below, Social history, Allergies, and medications have been entered into the medical record, reviewed, and no changes needed.   Review of Systems: No headache, visual changes, nausea, vomiting, diarrhea, constipation, dizziness, abdominal pain, skin rash, fevers, chills, night sweats, weight loss, swollen lymph nodes, body aches, joint swelling, muscle aches, chest pain, shortness of breath, mood changes, visual or auditory hallucinations.   Objective:    Vitals:   06/10/18 0810  BP: 125/67  Pulse: 72   General: Well Developed, well nourished, and in no acute distress.  Neuro/Psych: Alert and oriented x3, extra-ocular muscles intact, able to move all 4 extremities, sensation grossly intact. Skin: Warm and dry, no rashes noted.  Respiratory: Not using accessory muscles, speaking in full sentences, trachea midline.  Cardiovascular: Pulses  palpable, no extremity edema. Abdomen: Does not appear distended. MSK:  Left knee normal-appearing with no effusions. Range of motion 0-140 degrees compared to 0-150 degrees on the contralateral right side Tender to palpation medial joint line. Stable ligamentous exam. Positive medial McMurray's test. Intact flexion and extension strength. Normal gait.  Lab and Radiology Results X-ray images left knee independent reviewed by myself: Mild medial compartment DJD. Quad tendon insertion osteophyte.  No fracture or acute changes.  Awaiting formal radiology review  Limited musculoskeletal ultrasound of left knee shows normal-appearing Medial meniscus with slight hypoechoic change and is indicating probable superficial medial meniscus tear. Normal bony structures.  Impression and Recommendations:    Assessment and Plan: 57 y.o. male with left knee pain. Due to DJD and probable degenerative medial mensicus tear.  We discussed options.  Plan for a trial of diclofenac gel.  Recheck in 6 weeks. If not doing well next step is injection vs MRI and surgery.    Orders Placed This Encounter  Procedures  . DG Knee 1-2 Views Right    Standing Status:   Future    Standing Expiration Date:   08/11/2019    Order Specific Question:   Reason for Exam (SYMPTOM  OR DIAGNOSIS REQUIRED)    Answer:   For use with the left knee x-ray bilateral AP and Rosenberg standing.    Order Specific Question:   [facility]    [facility]  . DG Knee Complete 4 Views Left    Please include patellar sunrise, lateral,  and weightbearing bilateral AP and bilateral rosenberg views    Standing Status:   Future    Standing Expiration Date:   08/10/2019    Order Specific Question:   Reason for exam:    Answer:   Please include patellar sunrise, lateral, and weightbearing bilateral AP and bilateral rosenberg views    Comments:   Please include patellar sunrise, lateral, and weightbearing  bilateral AP and bilateral rosenberg views    Order Specific Question:   Preferred imaging location?    [facility]   No orders of the defined types were placed in this encounter.   Discussed warning signs or symptoms. Please see discharge instructions. Patient expresses understanding.

## 2018-06-10 NOTE — Patient Instructions (Signed)
Thank you for coming in today. Use the diclofenac gel.  If not doing well next step is injection vs MRI.  We could also consider nitroglycerine patches as a way of possibly avoiding surgery.  Ok to continue yoga and exercise.  Pay attention to you body. Avoid excessive knee flexion if you have pain.   Recheck with me in 6 weeks or so.    Meniscus Tear A meniscus tear is a knee injury in which a piece of the meniscus is torn. The meniscus is a thick, rubbery, wedge-shaped cartilage in the knee. Two menisci are located in each knee. They sit between the upper bone (femur) and lower bone (tibia) that make up the knee joint. Each meniscus acts as a shock absorber for the knee. A torn meniscus is one of the most common types of knee injuries. This injury can range from mild to severe. Surgery may be needed for a severe tear. What are the causes? This injury may be caused by any squatting, twisting, or pivoting movement. Sports-related injuries are the most common cause. These often occur from:  Running and stopping suddenly.  Changing direction.  Being tackled or knocked off your feet.  As people get older, their meniscus gets thinner and weaker. In these people, tears can happen more easily, such as from climbing stairs. What increases the risk? This injury is more likely to happen to:  People who play contact sports.  Males.  People who are 56 years of age.  What are the signs or symptoms? Symptoms of this injury include:  Knee pain, especially at the side of the knee joint. You may feel pain when the injury occurs, or you may only hear a pop and feel pain later.  A feeling that your knee is clicking, catching, locking, or giving way.  Not being able to fully bend or extend your knee.  Bruising or swelling in your knee.  How is this diagnosed? This injury may be diagnosed based on your symptoms and a physical exam. The physical exam may include:  Moving your knee in  different ways.  Feeling for tenderness.  Listening for a clicking sound.  Checking if your knee locks or catches.  You may also have tests, such as:  X-rays.  MRI.  A procedure to look inside your knee with a narrow surgical telescope (arthroscopy).  You may be referred to a knee specialist (orthopedic surgeon). How is this treated? Treatment for this injury depends on the severity of the tear. Treatment for a mild tear may include:  Rest.  Medicine to reduce pain and swelling. This is usually a nonsteroidal anti-inflammatory drug (NSAID).  A knee brace or an elastic sleeve or wrap.  Using crutches or a walker to keep weight off your knee and to help you walk.  Exercises to strengthen your knee (physical therapy).  You may need surgery if you have a severe tear or if other treatments are not working. Follow these instructions at home: Managing pain and swelling  Take over-the-counter and prescription medicines only as told by your health care provider.  If directed, apply ice to the injured area: ? Put ice in a plastic bag. ? Place a towel between your skin and the bag. ? Leave the ice on for 20 minutes, 2-3 times per day.  Raise (elevate) the injured area above the level of your heart while you are sitting or lying down. Activity  Do not use the injured limb to support your body weight  until your health care provider says that you can. Use crutches or a walker as told by your health care provider.  Return to your normal activities as told by your health care provider. Ask your health care provider what activities are safe for you.  Perform range-of-motion exercises only as told by your health care provider.  Begin doing exercises to strengthen your knee and leg muscles only as told by your health care provider. After you recover, your health care provider may recommend these exercises to help prevent another injury. General instructions  Use a knee brace or  elastic wrap as told by your health care provider.  Keep all follow-up visits as told by your health care provider. This is important. Contact a health care provider if:  You have a fever.  Your knee becomes red, tender, or swollen.  Your pain medicine is not helping.  Your symptoms get worse or do not improve after 2 weeks of home care. This information is not intended to replace advice given to you by your health care provider. Make sure you discuss any questions you have with your health care provider. Document Released: 03/09/2003 Document Revised: 05/24/2016 Document Reviewed: 04/11/2015 Elsevier Interactive Patient Education  [employer]2018 [employer]

## 2018-11-03 ENCOUNTER — Encounter: Payer: Self-pay | Admitting: Family Medicine

## 2018-11-03 ENCOUNTER — Ambulatory Visit (INDEPENDENT_AMBULATORY_CARE_PROVIDER_SITE_OTHER): Payer: BLUE CROSS/BLUE SHIELD | Admitting: Family Medicine

## 2018-11-03 VITALS — BP 114/62 | HR 63 | Ht 68.5 in | Wt 165.0 lb

## 2018-11-03 DIAGNOSIS — Z0000 Encounter for general adult medical examination without abnormal findings: Secondary | ICD-10-CM | POA: Diagnosis not present

## 2018-11-03 NOTE — Progress Notes (Signed)
Subjective:    Patient ID: Norman Rivera, male    DOB: 16-Dec-1961, 57 y.o.   MRN: [id number]  HPI  57 year old male is here today for complete physical exam.  Still doing hot yoga for exercise.  Review of Systems [place] of review of systems is negative except for HPI.  BP 114/62   Pulse 63   Ht 5' 8.5" (1.74 m)   Wt 165 lb (74.8 kg)   SpO2 100%   BMI 24.72 kg/m     No Known Allergies  No past medical history on file.  Past Surgical History:  Procedure Laterality Date  . HERNIA REPAIR  1974   as a child    Social History   Socioeconomic History  . Marital status: Married    Spouse name: [name]   . Number of children: 3  . Years of education: BS  . Highest education level: Not on file  Occupational History  . Occupation: Pensions consultant    Comment: [employer]  Social Needs  . Financial resource strain: Not on file  . Food insecurity:    Worry: Not on file    Inability: Not on file  . Transportation needs:    Medical: Not on file    Non-medical: Not on file  Tobacco Use  . Smoking status: Never Smoker  . Smokeless tobacco: Never Used  Substance and Sexual Activity  . Alcohol use: No    Alcohol/week: 0.0 standard drinks  . Drug use: No  . Sexual activity: Yes    Partners: Female  Lifestyle  . Physical activity:    Days per week: Not on file    Minutes per session: Not on file  . Stress: Not on file  Relationships  . Social connections:    Talks on phone: Not on file    Gets together: Not on file    Attends religious service: Not on file    Active member of club or organization: Not on file    Attends meetings of clubs or organizations: Not on file    Relationship status: Not on file  . Intimate partner violence:    Fear of current or ex partner: Not on file    Emotionally abused: Not on file    Physically abused: Not on file    Forced sexual activity: Not on file  Other Topics Concern  . Not on file  Social History Narrative   Development worker, international aid at [employer]. Bachelor of science degree. Married to [place]. 2 adult sons.  Doing Hot Yoga 3 days per week.      Family History  Problem Relation Age of Onset  . Hypertension Father   . Peripheral vascular disease Father     Outpatient Encounter Medications as of 11/03/2018  Medication Sig  . diclofenac sodium (VOLTAREN) 1 % GEL Apply 4 g topically 4 (four) times daily. To affected joint.   No facility-administered encounter medications on file as of 11/03/2018.           Objective:   Physical Exam  Constitutional: He is oriented to person, place, and time. He appears well-developed and well-nourished.  HENT:  Head: Normocephalic and atraumatic.  Right Ear: External ear normal.  Left Ear: External ear normal.  Nose: Nose normal.  Mouth/Throat: Oropharynx is clear and moist.  Eyes: Pupils are equal, round, and reactive to light. Conjunctivae and EOM are normal.  Neck: Normal range of motion. Neck supple. No thyromegaly present.  Cardiovascular: Normal rate, regular  rhythm, normal heart sounds and intact distal pulses.  Pulmonary/Chest: Effort normal and breath sounds normal.  Abdominal: Soft. Bowel sounds are normal. He exhibits no distension and no mass. There is no tenderness. There is no rebound and no guarding.  Musculoskeletal: Normal range of motion.  Lymphadenopathy:    He has no cervical adenopathy.  Neurological: He is alert and oriented to person, place, and time. He has normal reflexes.  Skin: Skin is warm and dry.  Psychiatric: He has a normal mood and affect. His behavior is normal. Judgment and thought content normal.       Assessment & Plan:  CPE Keep up a regular exercise program and make sure you are eating a healthy diet Your vaccines are up to date.   Colon Ca screening - he is interested in colon guard. He will check with insurance on coverage.    Due for screening labs.  CMP, lipid, PSA.  Formation provided for shingles vaccine.

## 2018-11-03 NOTE — Patient Instructions (Signed)

## 2018-11-04 LAB — LIPID PANEL
Cholesterol: 192 mg/dL (ref <200)
HDL: 61 mg/dL (ref >40)
LDL Cholesterol (Calc): 116 mg/dL (calc) — ABNORMAL HIGH
Non-HDL Cholesterol (Calc): 131 mg/dL (calc) — ABNORMAL HIGH (ref <130)
TRIGLYCERIDES: 63 mg/dL (ref <150)
Total CHOL/HDL Ratio: 3.1 (calc) (ref <5.0)

## 2018-11-04 LAB — COMPLETE METABOLIC PANEL WITH GFR
AG Ratio: 1.8 (calc) (ref 1.0–2.5)
ALT: 14 U/L (ref 9–46)
AST: 14 U/L (ref 10–35)
Albumin: 4.5 g/dL (ref 3.6–5.1)
Alkaline phosphatase (APISO): 97 U/L (ref 40–115)
BILIRUBIN TOTAL: 0.6 mg/dL (ref 0.2–1.2)
BUN: 11 mg/dL (ref 7–25)
CHLORIDE: 102 mmol/L (ref 98–110)
CO2: 31 mmol/L (ref 20–32)
Calcium: 9.5 mg/dL (ref 8.6–10.3)
Creat: 1 mg/dL (ref 0.70–1.33)
GFR, EST AFRICAN AMERICAN: 97 mL/min/{1.73_m2} (ref >=60)
GFR, Est Non African American: 84 mL/min/{1.73_m2} (ref >=60)
GLOBULIN: 2.5 g/dL (ref 1.9–3.7)
Glucose, Bld: 86 mg/dL (ref 65–99)
Potassium: 4.8 mmol/L (ref 3.5–5.3)
SODIUM: 138 mmol/L (ref 135–146)
Total Protein: 7 g/dL (ref 6.1–8.1)

## 2018-11-04 LAB — PSA: PSA: 0.5 ng/mL (ref <=4.0)

## 2018-11-10 IMAGING — DX DG KNEE 1-2V*R*
4 series · 4 of 4 positions shown · non-contrast
Comparison: None.

CLINICAL DATA: Bilateral knee pain.  Left knee pain for 3 months.

EXAM:
RIGHT KNEE - 1-2 VIEW; LEFT KNEE - COMPLETE 4+ VIEW

[knee lat]
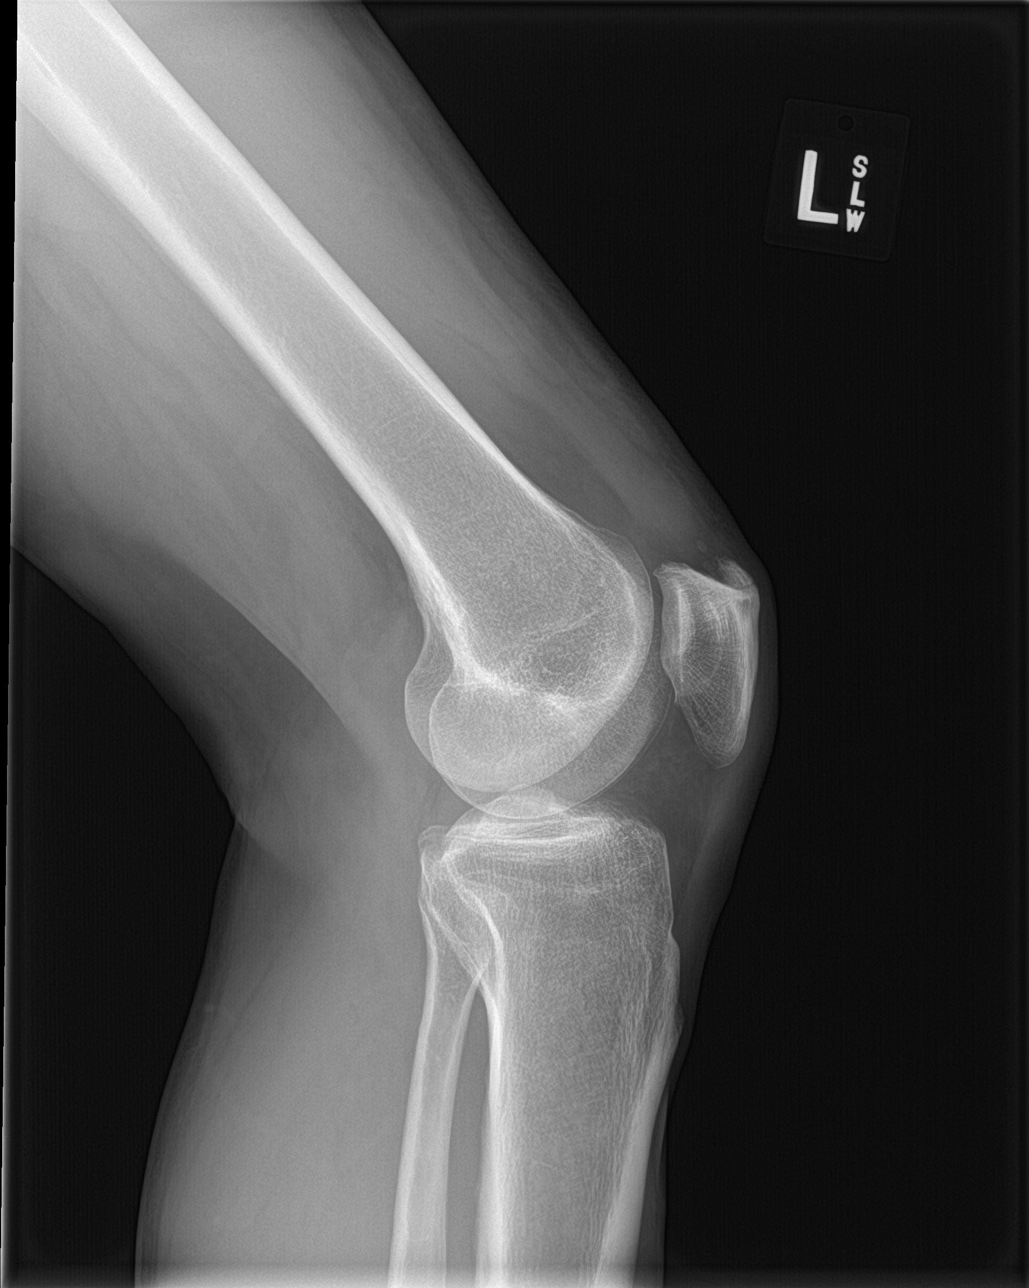

[knee sunrise]
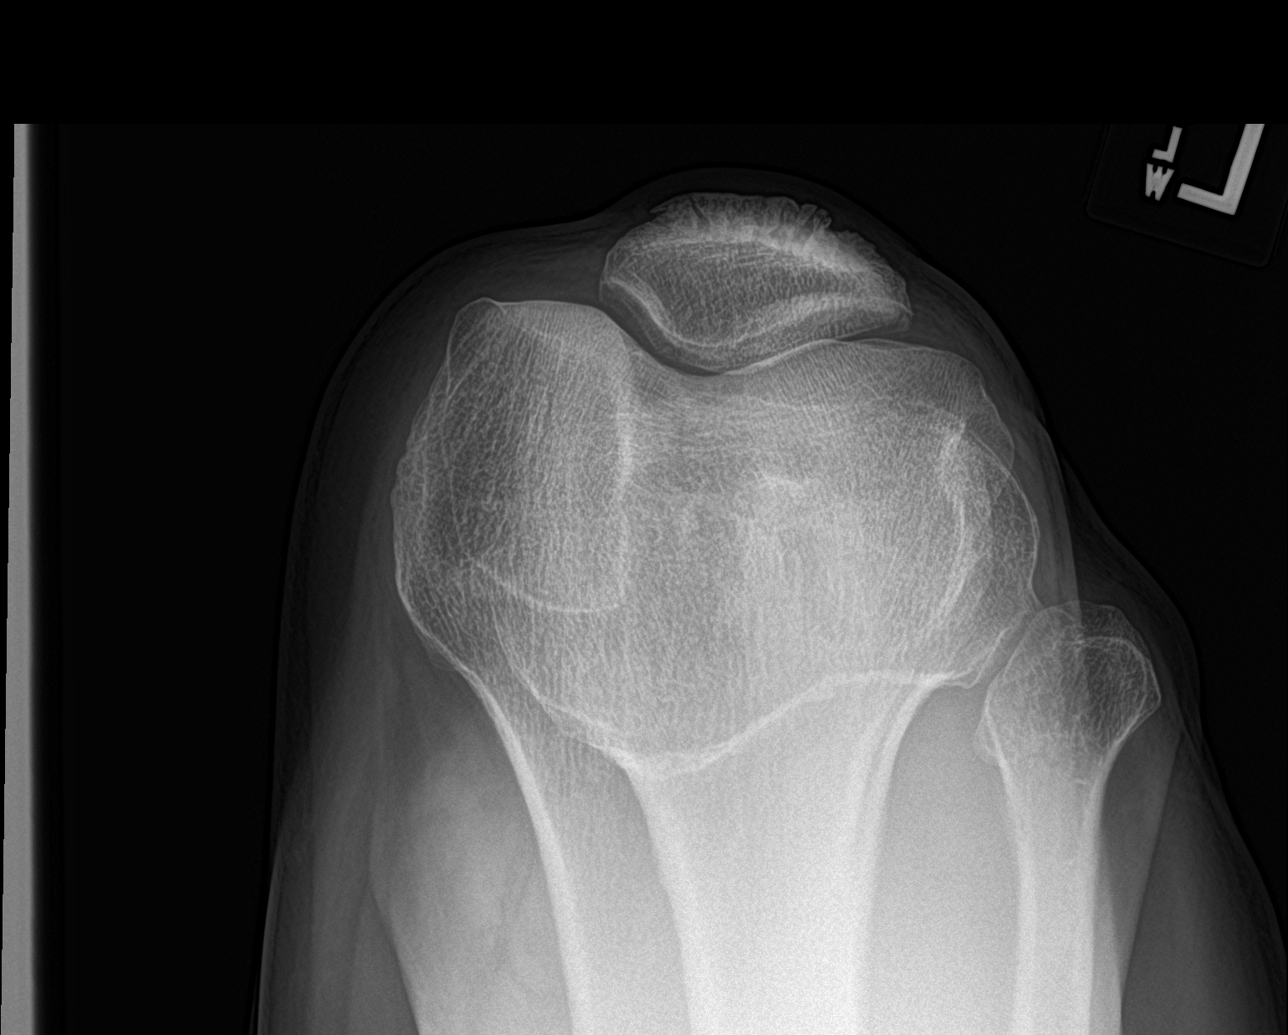

[knee ap bilat standing (1 of 2)]
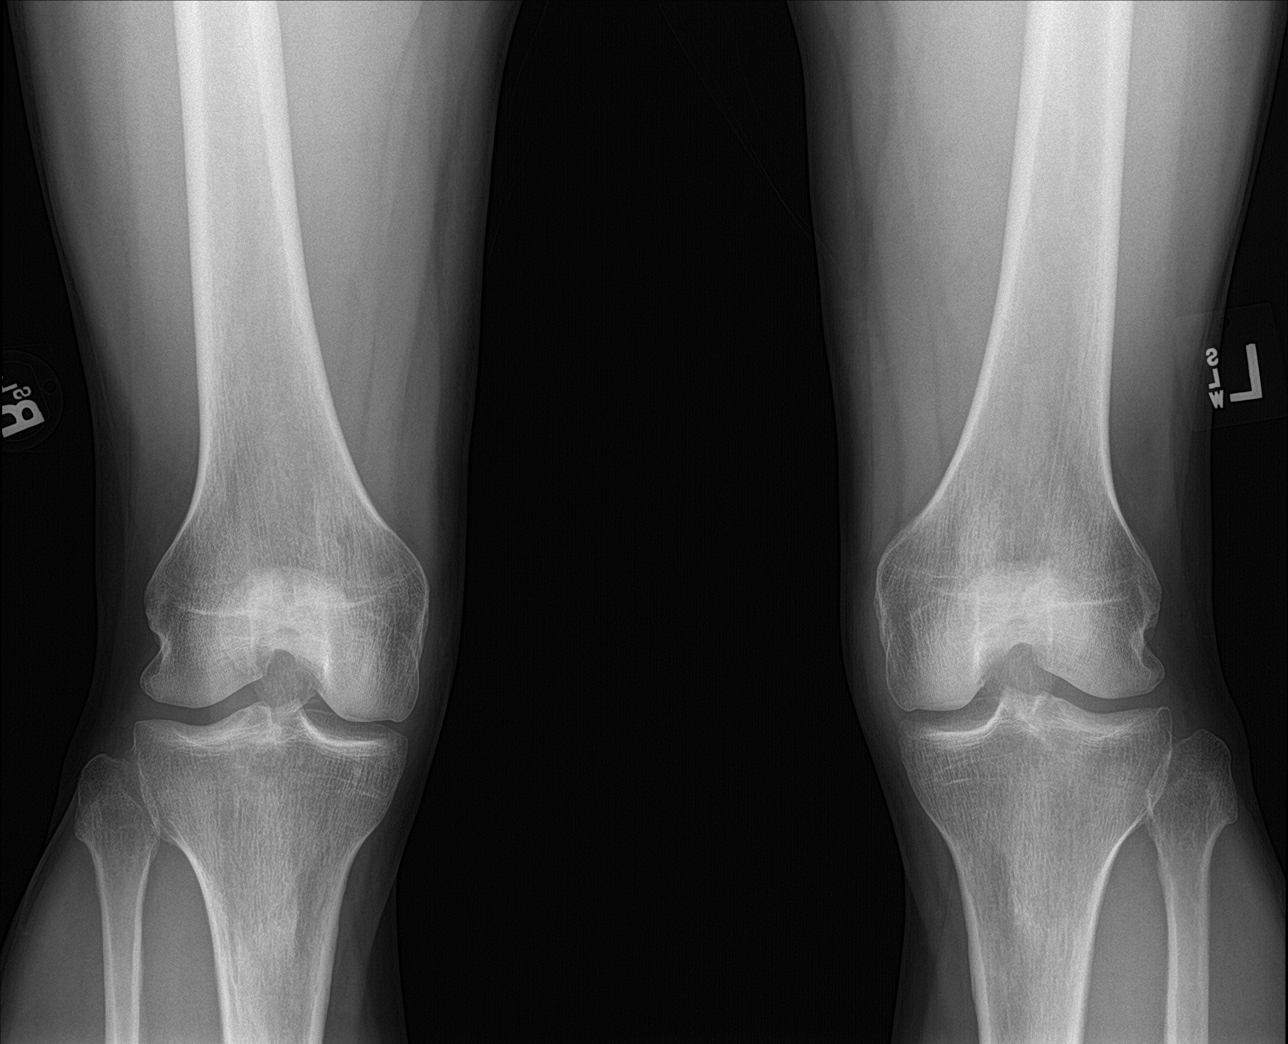

[knee ap bilat standing (2 of 2)]
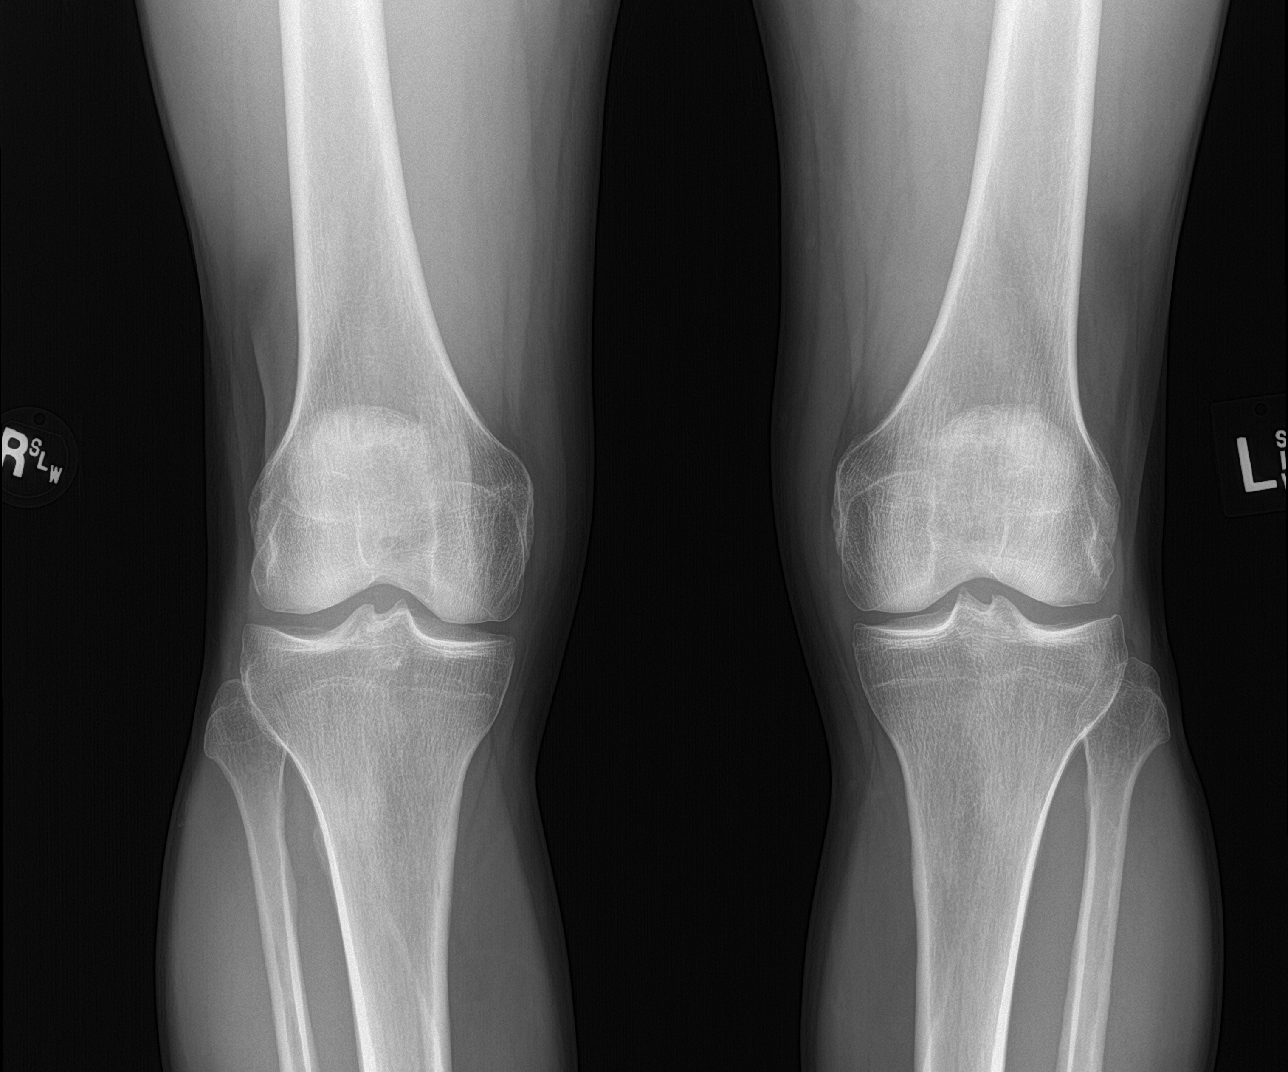

[4 of 4 positions shown; findings below may reference images not displayed]

FINDINGS: Bones: No fracture or dislocation. Normal bone mineralization. No
periostitis. Enthesopathic changes at the quadriceps tendon
insertion.

Joints: Normal alignment. No erosive changes. Medial and lateral
femorotibial compartment joint spaces are maintained bilaterally.
Mild patellofemoral compartment joint space narrowing with small
marginal osteophytes. No significant joint effusion.

Soft tissue: No soft tissue abnormality. No radiopaque foreign body.
No chondrocalcinosis.
IMPRESSION: 1.  No acute osseous injury of bilateral knees.
2. Minimal osteoarthritis of the patellofemoral compartment of the
left knee.
3. No significant arthropathy of the medial and lateral femorotibial
compartments of bilateral knees.

## 2019-03-13 DIAGNOSIS — M9905 Segmental and somatic dysfunction of pelvic region: Secondary | ICD-10-CM | POA: Diagnosis not present

## 2019-03-13 DIAGNOSIS — M25551 Pain in right hip: Secondary | ICD-10-CM | POA: Diagnosis not present

## 2019-03-13 DIAGNOSIS — M545 Low back pain: Secondary | ICD-10-CM | POA: Diagnosis not present

## 2019-03-13 DIAGNOSIS — M9903 Segmental and somatic dysfunction of lumbar region: Secondary | ICD-10-CM | POA: Diagnosis not present

## 2019-03-23 DIAGNOSIS — M9903 Segmental and somatic dysfunction of lumbar region: Secondary | ICD-10-CM | POA: Diagnosis not present

## 2019-03-23 DIAGNOSIS — M9905 Segmental and somatic dysfunction of pelvic region: Secondary | ICD-10-CM | POA: Diagnosis not present

## 2019-03-23 DIAGNOSIS — M25551 Pain in right hip: Secondary | ICD-10-CM | POA: Diagnosis not present

## 2019-03-23 DIAGNOSIS — M545 Low back pain: Secondary | ICD-10-CM | POA: Diagnosis not present

## 2019-03-27 DIAGNOSIS — M9905 Segmental and somatic dysfunction of pelvic region: Secondary | ICD-10-CM | POA: Diagnosis not present

## 2019-03-27 DIAGNOSIS — M545 Low back pain: Secondary | ICD-10-CM | POA: Diagnosis not present

## 2019-03-27 DIAGNOSIS — M25551 Pain in right hip: Secondary | ICD-10-CM | POA: Diagnosis not present

## 2019-03-27 DIAGNOSIS — M9903 Segmental and somatic dysfunction of lumbar region: Secondary | ICD-10-CM | POA: Diagnosis not present

## 2019-11-05 ENCOUNTER — Encounter: Payer: Self-pay | Admitting: Family Medicine

## 2019-11-05 ENCOUNTER — Other Ambulatory Visit: Payer: Self-pay

## 2019-11-05 ENCOUNTER — Ambulatory Visit (INDEPENDENT_AMBULATORY_CARE_PROVIDER_SITE_OTHER): Payer: BC Managed Care – PPO | Admitting: Family Medicine

## 2019-11-05 VITALS — BP 116/83 | HR 82 | Temp 98.2°F | Wt 172.0 lb

## 2019-11-05 DIAGNOSIS — Z23 Encounter for immunization: Secondary | ICD-10-CM | POA: Diagnosis not present

## 2019-11-05 DIAGNOSIS — Z0000 Encounter for general adult medical examination without abnormal findings: Secondary | ICD-10-CM | POA: Diagnosis not present

## 2019-11-05 DIAGNOSIS — Z1211 Encounter for screening for malignant neoplasm of colon: Secondary | ICD-10-CM

## 2019-11-05 NOTE — Patient Instructions (Signed)

## 2019-11-05 NOTE — Progress Notes (Signed)
CPD- Established Patient Office Visit  Subjective:  Patient ID: Norman Rivera, male    DOB: 1961-08-13  Age: 58 y.o. MRN: [id number]  CC: No chief complaint on file.   HPI Norman Rivera presents for CPE. He is doing well. No exercising as much as he used to with COVID and diet hasn't been well controlled.   No past medical history on file.  Past Surgical History:  Procedure Laterality Date  . [place]   as a child    Family History  Problem Relation Age of Onset  . Hypertension Father   . Peripheral vascular disease Father     Social History   Socioeconomic History  . Marital status: Married    Spouse name: Santiago Glad   . Number of children: 3  . Years of education: BS  . Highest education level: Not on file  Occupational History  . Occupation: Engineer, building services    Comment: [employer]  Social Needs  . Financial resource strain: Not on file  . Food insecurity    Worry: Not on file    Inability: Not on file  . Transportation needs    Medical: Not on file    Non-medical: Not on file  Tobacco Use  . Smoking status: Never Smoker  . Smokeless tobacco: Never Used  Substance and Sexual Activity  . Alcohol use: No    Alcohol/week: 0.0 standard drinks  . Drug use: No  . Sexual activity: Yes    Partners: Female  Lifestyle  . Physical activity    Days per week: Not on file    Minutes per session: Not on file  . Stress: Not on file  Relationships  . Social Herbalist on phone: Not on file    Gets together: Not on file    Attends religious service: Not on file    Active member of club or organization: Not on file    Attends meetings of clubs or organizations: Not on file    Relationship status: Not on file  . Intimate partner violence    Fear of current or ex partner: Not on file    Emotionally abused: Not on file    Physically abused: Not on file    Forced sexual activity: Not on file  Other Topics Concern  . Not on file  Social History  Narrative   Engineer, building services at [employer]. Bachelor of science degree. Married to [place]. 2 adult sons.  Doing Hot Yoga 3 days per week.      Outpatient Medications Prior to Visit  Medication Sig Dispense Refill  . diclofenac sodium (VOLTAREN) 1 % GEL Apply 4 g topically 4 (four) times daily. To affected joint. 100 g 11   No facility-administered medications prior to visit.     No Known Allergies  ROS Review of Systems    Objective:    Physical Exam  Constitutional: He is oriented to person, place, and time. He appears well-developed and well-nourished.  HENT:  Head: Normocephalic and atraumatic.  Right Ear: External ear normal.  Left Ear: External ear normal.  Nose: Nose normal.  Mouth/Throat: Oropharynx is clear and moist.  Eyes: Pupils are equal, round, and reactive to light. Conjunctivae and EOM are normal.  Neck: Normal range of motion. Neck supple. No thyromegaly present.  Cardiovascular: Normal rate, regular rhythm, normal heart sounds and intact distal pulses.  Pulmonary/Chest: Effort normal and breath sounds normal.  Abdominal: Soft. Bowel sounds are  normal. He exhibits no distension and no mass. There is no abdominal tenderness. There is no rebound and no guarding.  Musculoskeletal: Normal range of motion.  Lymphadenopathy:    He has no cervical adenopathy.  Neurological: He is alert and oriented to person, place, and time. He has normal reflexes.  Skin: Skin is warm and dry.  Psychiatric: He has a normal mood and affect. His behavior is normal. Judgment and thought content normal.    BP 116/83   Pulse 82   Temp 98.2 F (36.8 C) (Oral)   Wt 172 lb (78 kg)   BMI 25.77 kg/m  Wt Readings from Last 3 Encounters:  11/05/19 172 lb (78 kg)  11/03/18 165 lb (74.8 kg)  06/10/18 163 lb (73.9 kg)     There are no preventive care reminders to display for this patient.  There are no preventive care reminders to display for this patient.  Lab Results   Component Value Date   TSH 2.75 09/06/2017   Lab Results  Component Value Date   WBC 6.8 09/06/2017   HGB 15.6 09/06/2017   HCT 45.9 09/06/2017   MCV 87.3 09/06/2017   PLT 229 09/06/2017   Lab Results  Component Value Date   NA 138 11/03/2018   K 4.8 11/03/2018   CO2 31 11/03/2018   GLUCOSE 86 11/03/2018   BUN 11 11/03/2018   CREATININE 1.00 11/03/2018   BILITOT 0.6 11/03/2018   ALKPHOS 93 04/24/2016   AST 14 11/03/2018   ALT 14 11/03/2018   PROT 7.0 11/03/2018   ALBUMIN 4.3 04/24/2016   CALCIUM 9.5 11/03/2018   Lab Results  Component Value Date   CHOL 192 11/03/2018   Lab Results  Component Value Date   HDL 61 11/03/2018   Lab Results  Component Value Date   LDLCALC 116 (H) 11/03/2018   Lab Results  Component Value Date   TRIG 63 11/03/2018   Lab Results  Component Value Date   CHOLHDL 3.1 11/03/2018   No results found for: HGBA1C    Assessment & Plan:   Problem List Items Addressed This Visit    None    Visit Diagnoses    Routine general medical examination at a health care facility    -  Primary   Relevant Orders   COMPLETE METABOLIC PANEL WITH GFR   Lipid Panel w/reflex Direct LDL   PSA   Cologuard   Colon cancer screening       Relevant Orders   Cologuard     Keep up a regular exercise program and make sure you are eating a healthy diet Try to eat 4 servings of dairy a day, or if you are lactose intolerant take a calcium with vitamin D daily.  Your vaccines are up to date.  Discussed need for colon cancer screening. He Ok with doing cologuard.   No orders of the defined types were placed in this encounter.   Follow-up: Return in about 1 year (around 11/04/2020) for Wellness Exam.    Nani Gasser, MD

## 2019-11-06 LAB — LIPID PANEL W/REFLEX DIRECT LDL
Cholesterol: 198 mg/dL (ref <200)
HDL: 52 mg/dL (ref >=40)
LDL Cholesterol (Calc): 125 mg/dL (calc) — ABNORMAL HIGH
Non-HDL Cholesterol (Calc): 146 mg/dL (calc) — ABNORMAL HIGH (ref <130)
Total CHOL/HDL Ratio: 3.8 (calc) (ref <5.0)
Triglycerides: 104 mg/dL (ref <150)

## 2019-11-06 LAB — COMPLETE METABOLIC PANEL WITH GFR
AG Ratio: 2 (calc) (ref 1.0–2.5)
ALT: 21 U/L (ref 9–46)
AST: 17 U/L (ref 10–35)
Albumin: 4.7 g/dL (ref 3.6–5.1)
Alkaline phosphatase (APISO): 90 U/L (ref 35–144)
BUN: 11 mg/dL (ref 7–25)
CO2: 29 mmol/L (ref 20–32)
Calcium: 9.8 mg/dL (ref 8.6–10.3)
Chloride: 100 mmol/L (ref 98–110)
Creat: 1 mg/dL (ref 0.70–1.33)
GFR, Est African American: 96 mL/min/{1.73_m2} (ref >=60)
GFR, Est Non African American: 83 mL/min/{1.73_m2} (ref >=60)
Globulin: 2.4 g/dL (calc) (ref 1.9–3.7)
Glucose, Bld: 85 mg/dL (ref 65–99)
Potassium: 4.3 mmol/L (ref 3.5–5.3)
Sodium: 136 mmol/L (ref 135–146)
Total Bilirubin: 1.2 mg/dL (ref 0.2–1.2)
Total Protein: 7.1 g/dL (ref 6.1–8.1)

## 2019-11-06 LAB — PSA: PSA: 0.6 ng/mL (ref <=4.0)

## 2020-01-11 DIAGNOSIS — E7800 Pure hypercholesterolemia, unspecified: Secondary | ICD-10-CM | POA: Insufficient documentation

## 2020-03-24 ENCOUNTER — Ambulatory Visit: Payer: Self-pay | Attending: Internal Medicine

## 2020-03-24 DIAGNOSIS — Z23 Encounter for immunization: Secondary | ICD-10-CM

## 2020-03-24 NOTE — Progress Notes (Signed)
   Covid-19 Vaccination Clinic  Name:  Norman Rivera    MRN: [id number] DOB: Jan 14, 1961  03/24/2020  Norman Rivera was observed post Covid-19 immunization for 15 minutes without incident. He was provided with Vaccine Information Sheet and instruction to access the V-Safe system.   Norman Rivera was instructed to call 911 with any severe reactions post vaccine: Marland Kitchen Difficulty breathing  . Swelling of face and throat  . A fast heartbeat  . A bad rash all over body  . Dizziness and weakness   Immunizations Administered    Name Date Dose VIS Date Route   Pfizer COVID-19 Vaccine 03/24/2020  4:17 PM 0.3 mL 12/11/2019 Intramuscular   Manufacturer: [employer], [employer]   Lot: [id number]   NDC: [address]-0

## 2020-04-18 ENCOUNTER — Ambulatory Visit: Payer: Self-pay | Attending: Internal Medicine

## 2020-04-18 DIAGNOSIS — Z23 Encounter for immunization: Secondary | ICD-10-CM

## 2020-04-18 NOTE — Progress Notes (Signed)
   Covid-19 Vaccination Clinic  Name:  Norman Rivera    MRN: [id number] DOB: 1961/08/16  04/18/2020  Mr. Lemme was observed post Covid-19 immunization for 15 minutes without incident. He was provided with Vaccine Information Sheet and instruction to access the V-Safe system.   Mr. Krapf was instructed to call 911 with any severe reactions post vaccine: Marland Kitchen Difficulty breathing  . Swelling of face and throat  . A fast heartbeat  . A bad rash all over body  . Dizziness and weakness   Immunizations Administered    Name Date Dose VIS Date Route   Pfizer COVID-19 Vaccine 04/18/2020  5:04 PM 0.3 mL 02/24/2019 Intramuscular   Manufacturer: [employer], [employer]   Lot: [id number]   NDC: [address]-8

## 2020-06-21 ENCOUNTER — Telehealth: Payer: Self-pay | Admitting: Family Medicine

## 2020-06-21 DIAGNOSIS — Z1211 Encounter for screening for malignant neoplasm of colon: Secondary | ICD-10-CM

## 2020-06-21 NOTE — Telephone Encounter (Signed)
This was last ordered in 2020. Will fax new order for this year.

## 2020-06-21 NOTE — Telephone Encounter (Signed)
Never received Cologuard kit.  Can we please fax any request.

## 2020-07-01 ENCOUNTER — Telehealth: Payer: Self-pay | Admitting: Family Medicine

## 2020-07-01 NOTE — Telephone Encounter (Signed)
Call pt: please see if he received his cologuard kit.  Just wanted to make sure.

## 2020-07-06 NOTE — Telephone Encounter (Signed)
Called and asked pt about this and he has received the kit and has some questions before completing the process. He is going to contact his insurance company regarding this and go from there.

## 2020-07-17 LAB — COLOGUARD
COLOGUARD: NEGATIVE
Cologuard: NEGATIVE

## 2020-07-19 ENCOUNTER — Encounter: Payer: Self-pay | Admitting: Family Medicine

## 2020-07-19 ENCOUNTER — Telehealth: Payer: Self-pay | Admitting: Family Medicine

## 2020-07-19 NOTE — Telephone Encounter (Signed)
Call pt: cologuard is negative. repet colon ca screening in 3 yrs.

## 2020-07-20 NOTE — Telephone Encounter (Signed)
Patient was notified and voices understanding. No other questions.

## 2020-11-07 ENCOUNTER — Encounter: Payer: Self-pay | Admitting: Family Medicine

## 2020-11-07 ENCOUNTER — Telehealth: Payer: Self-pay | Admitting: Family Medicine

## 2020-11-07 ENCOUNTER — Ambulatory Visit (INDEPENDENT_AMBULATORY_CARE_PROVIDER_SITE_OTHER): Payer: 59 | Admitting: Family Medicine

## 2020-11-07 ENCOUNTER — Other Ambulatory Visit: Payer: Self-pay

## 2020-11-07 VITALS — BP 116/61 | HR 82 | Ht 69.0 in | Wt 171.0 lb

## 2020-11-07 DIAGNOSIS — Z125 Encounter for screening for malignant neoplasm of prostate: Secondary | ICD-10-CM | POA: Diagnosis not present

## 2020-11-07 DIAGNOSIS — Z23 Encounter for immunization: Secondary | ICD-10-CM | POA: Diagnosis not present

## 2020-11-07 DIAGNOSIS — Z0000 Encounter for general adult medical examination without abnormal findings: Secondary | ICD-10-CM | POA: Diagnosis not present

## 2020-11-07 DIAGNOSIS — E7800 Pure hypercholesterolemia, unspecified: Secondary | ICD-10-CM | POA: Diagnosis not present

## 2020-11-07 NOTE — Addendum Note (Signed)
Addended by: Chalmers Cater on: 11/07/2020 10:44 AM   Modules accepted: Orders

## 2020-11-07 NOTE — Assessment & Plan Note (Signed)
Due to recheck lipids. 

## 2020-11-07 NOTE — Patient Instructions (Signed)

## 2020-11-07 NOTE — Progress Notes (Signed)
CPE  Established Patient Office Visit  Subjective:  Patient ID: Norman Rivera, male    DOB: Dec 17, 1961  Age: 59 y.o. MRN: [id number]  CC:  Chief Complaint  Patient presents with  . Annual Exam    HPI Norman Rivera presents for CPE.    No past medical history on file.  Past Surgical History:  Procedure Laterality Date  . HERNIA REPAIR  1974   as a child    Family History  Problem Relation Age of Onset  . Hypertension Father   . Peripheral vascular disease Father     Social History   Socioeconomic History  . Marital status: Married    Spouse name: [name]   . Number of children: 3  . Years of education: BS  . Highest education level: Not on file  Occupational History  . Occupation: Pensions consultant    Comment: [employer]  Tobacco Use  . Smoking status: Never Smoker  . Smokeless tobacco: Never Used  Substance and Sexual Activity  . Alcohol use: No    Alcohol/week: 0.0 standard drinks  . Drug use: No  . Sexual activity: Yes    Partners: Female  Other Topics Concern  . Not on file  Social History Narrative   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 Married to [place]. 2 adult sons.  Doing Hot Yoga 3 days per week.     Social Determinants of Health   Financial Resource Strain:   . Difficulty of Paying Living Expenses: Not on file  Food Insecurity:   . Worried About Programme researcher, broadcasting/film/video in the Last Year: Not on file  . Ran Out of Food in the Last Year: Not on file  Transportation Needs:   . Lack of Transportation (Medical): Not on file  . Lack of Transportation (Non-Medical): Not on file  Physical Activity:   . Days of Exercise per Week: Not on file  . Minutes of Exercise per Session: Not on file  Stress:   . Feeling of Stress : Not on file  Social Connections:   . Frequency of Communication with Friends and Family: Not on file  . Frequency of Social Gatherings with Friends and Family: Not on file  . Attends Religious Services:  Not on file  . Active Member of Clubs or Organizations: Not on file  . Attends Banker Meetings: Not on file  . Marital Status: Not on file  Intimate Partner Violence:   . Fear of Current or Ex-Partner: Not on file  . Emotionally Abused: Not on file  . Physically Abused: Not on file  . Sexually Abused: Not on file    Outpatient Medications Prior to Visit  Medication Sig Dispense Refill  . diclofenac sodium (VOLTAREN) 1 % GEL Apply 4 g topically 4 (four) times daily. To affected joint. 100 g 11   No facility-administered medications prior to visit.    No Known Allergies  ROS Review of Systems    Objective:    Physical Exam Constitutional:      Appearance: He is well-developed.  HENT:     Head: Normocephalic and atraumatic.  Cardiovascular:     Rate and Rhythm: Normal rate and regular rhythm.     Heart sounds: Normal heart sounds.  Pulmonary:     Effort: Pulmonary effort is normal.     Breath sounds: Normal breath sounds.  Skin:    General: Skin is warm and dry.  Neurological:     Mental  Status: He is alert and oriented to person, place, and time.  Psychiatric:        Behavior: Behavior normal.    BP 116/61   Pulse 82   Ht 5\' 9"  (1.753 m)   Wt 171 lb (77.6 kg)   SpO2 100%   BMI 25.25 kg/m  Wt Readings from Last 3 Encounters:  11/07/20 171 lb (77.6 kg)  11/05/19 172 lb (78 kg)  11/03/18 165 lb (74.8 kg)     There are no preventive care reminders to display for this patient.  There are no preventive care reminders to display for this patient.  Lab Results  Component Value Date   TSH 2.75 09/06/2017   Lab Results  Component Value Date   WBC 6.8 09/06/2017   HGB 15.6 09/06/2017   HCT 45.9 09/06/2017   MCV 87.3 09/06/2017   PLT 229 09/06/2017   Lab Results  Component Value Date   NA 136 11/05/2019   K 4.3 11/05/2019   CO2 29 11/05/2019   GLUCOSE 85 11/05/2019   BUN 11 11/05/2019   CREATININE 1.00 11/05/2019   BILITOT 1.2  11/05/2019   ALKPHOS 93 04/24/2016   AST 17 11/05/2019   ALT 21 11/05/2019   PROT 7.1 11/05/2019   ALBUMIN 4.3 04/24/2016   CALCIUM 9.8 11/05/2019   Lab Results  Component Value Date   CHOL 198 11/05/2019   Lab Results  Component Value Date   HDL 52 11/05/2019   Lab Results  Component Value Date   LDLCALC 125 (H) 11/05/2019   Lab Results  Component Value Date   TRIG 104 11/05/2019   Lab Results  Component Value Date   CHOLHDL 3.8 11/05/2019   No results found for: HGBA1C    Assessment & Plan:   Problem List Items Addressed This Visit      Other   Elevated LDL cholesterol level    Due to recheck lipids.        Other Visit Diagnoses    Routine general medical examination at a health care facility    -  Primary   Relevant Orders   PSA   Lipid Panel w/reflex Direct LDL   COMPLETE METABOLIC PANEL WITH GFR   Screening PSA (prostate specific antigen)       Relevant Orders   PSA   Need for immunization against influenza       Relevant Orders   Flu Vaccine QUAD 36+ mos IM (Completed)     Keep up a regular exercise program and make sure you are eating a healthy diet Try to eat 4 servings of dairy a day, or if you are lactose intolerant take a calcium with vitamin D daily.  Your vaccines are up to date.  Discussed need for shingles vaccine.  He says he will think about it but wants to hold off since he just had his Covid booster about 2 weeks ago.  Discussed getting updated blood work he has some mildly elevated LDL.  And we have been doing PSA screening for him so we will go ahead and get those labs today.  He did have the Covid booster so we did update his chart.   No orders of the defined types were placed in this encounter.   Follow-up: No follow-ups on file.    13/05/2019, MD

## 2020-11-07 NOTE — Telephone Encounter (Signed)
Patient was in for a Physical with Dr. Linford Arnold this morning, and went downstairs for Lab Work after Appt, he was going to be charged $183 and some change with Insurance for Labs, and they mentioned to him something about coding. He didn't know if these Labs should be covered with a Physical or if anything could be changed to get the cost brought down. He wanted a call back regarding this: [phone]. AM

## 2020-11-07 NOTE — Telephone Encounter (Signed)
I added a code. Patient advised.

## 2021-11-13 ENCOUNTER — Encounter: Payer: Self-pay | Admitting: Family Medicine

## 2021-11-13 ENCOUNTER — Ambulatory Visit (INDEPENDENT_AMBULATORY_CARE_PROVIDER_SITE_OTHER): Payer: 59 | Admitting: Family Medicine

## 2021-11-13 ENCOUNTER — Other Ambulatory Visit: Payer: Self-pay

## 2021-11-13 VITALS — BP 117/76 | HR 76 | Ht 69.0 in | Wt 176.0 lb

## 2021-11-13 DIAGNOSIS — Z125 Encounter for screening for malignant neoplasm of prostate: Secondary | ICD-10-CM

## 2021-11-13 DIAGNOSIS — Z0000 Encounter for general adult medical examination without abnormal findings: Secondary | ICD-10-CM | POA: Diagnosis not present

## 2021-11-13 DIAGNOSIS — Z23 Encounter for immunization: Secondary | ICD-10-CM | POA: Diagnosis not present

## 2021-11-13 NOTE — Progress Notes (Signed)
Established Patient Office Visit  Subjective:  Patient ID: Norman Rivera, male    DOB: [birth year]  Age: 60 y.o. MRN: [id number]  CC:  Chief Complaint  Patient presents with   Annual Exam    HPI Norman Rivera presents for CPE. He is doing well.  He is not currently exercising regularly but plans to start back with hot yoga.  Recent chest pain shortness of breath etc.  No past medical history on file.  Past Surgical History:  Procedure Laterality Date   HERNIA REPAIR  1974   as a child    Family History  Problem Relation Age of Onset   Hypertension Father    Peripheral vascular disease Father     Social History   Socioeconomic History   Marital status: Married    Spouse name: Norman Rivera    Number of children: 3   Years of education: BS   Highest education level: Not on file  Occupational History   Occupation: Pensions consultant    Comment: [employer]  Tobacco Use   Smoking status: Never   Smokeless tobacco: Never  Substance and Sexual Activity   Alcohol use: No    Alcohol/week: 0.0 standard drinks   Drug use: No   Sexual activity: Yes    Partners: Female  Other Topics Concern   Not on file  Social History Narrative   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 Married to Norman Rivera. 2 adult sons.  Doing Hot Yoga 3 days per week.     Social Determinants of Health   Financial Resource Strain: Not on file  Food Insecurity: Not on file  Transportation Needs: Not on file  Physical Activity: Not on file  Stress: Not on file  Social Connections: Not on file  Intimate Partner Violence: Not on file    No outpatient medications prior to visit.   No facility-administered medications prior to visit.    No Known Allergies  ROS Review of Systems    Objective:    Physical Exam  BP 117/76   Pulse 76   Ht 5\' 9"  (1.753 m)   Wt 176 lb (79.8 kg)   SpO2 98%   BMI 25.99 kg/m  Wt Readings from Last 3 Encounters:  11/13/21 176 lb (79.8 kg)   11/07/20 171 lb (77.6 kg)  11/05/19 172 lb (78 kg)     There are no preventive care reminders to display for this patient.   There are no preventive care reminders to display for this patient.  Lab Results  Component Value Date   TSH 2.75 09/06/2017   Lab Results  Component Value Date   WBC 6.8 09/06/2017   HGB 15.6 09/06/2017   HCT 45.9 09/06/2017   MCV 87.3 09/06/2017   PLT 229 09/06/2017   Lab Results  Component Value Date   NA 136 11/05/2019   K 4.3 11/05/2019   CO2 29 11/05/2019   GLUCOSE 85 11/05/2019   BUN 11 11/05/2019   CREATININE 1.00 11/05/2019   BILITOT 1.2 11/05/2019   ALKPHOS 93 04/24/2016   AST 17 11/05/2019   ALT 21 11/05/2019   PROT 7.1 11/05/2019   ALBUMIN 4.3 04/24/2016   CALCIUM 9.8 11/05/2019   Lab Results  Component Value Date   CHOL 198 11/05/2019   Lab Results  Component Value Date   HDL 52 11/05/2019   Lab Results  Component Value Date   LDLCALC 125 (H) 11/05/2019   Lab Results  Component Value  Date   TRIG 104 11/05/2019   Lab Results  Component Value Date   CHOLHDL 3.8 11/05/2019   No results found for: HGBA1C    Assessment & Plan:   Problem List Items Addressed This Visit   None Visit Diagnoses     Need for immunization against influenza    -  Primary   Relevant Orders   Flu Vaccine QUAD 45mo+IM (Fluarix, Fluzone & Alfiuria Quad PF) (Completed)   Routine general medical examination at a health care facility       Screening PSA (prostate specific antigen)          Keep up a regular exercise program and make sure you are eating a healthy diet Try to eat 4 servings of dairy a day, or if you are lactose intolerant take a calcium with vitamin D daily.  Your vaccines are up to date.  Flu vaccine given today. Clines shingles vaccine. He declined to do labs today it has been 2 years since his last labs.  No orders of the defined types were placed in this encounter.   Follow-up: Return in about 1 year (around  11/13/2022) for Wellness Exam.    Nani Gasser, MD

## 2022-11-19 ENCOUNTER — Ambulatory Visit (INDEPENDENT_AMBULATORY_CARE_PROVIDER_SITE_OTHER): Payer: 59 | Admitting: Family Medicine

## 2022-11-19 ENCOUNTER — Encounter: Payer: Self-pay | Admitting: Family Medicine

## 2022-11-19 VITALS — BP 127/70 | HR 74 | Ht 69.0 in | Wt 179.0 lb

## 2022-11-19 DIAGNOSIS — Z0000 Encounter for general adult medical examination without abnormal findings: Secondary | ICD-10-CM

## 2022-11-19 DIAGNOSIS — Z125 Encounter for screening for malignant neoplasm of prostate: Secondary | ICD-10-CM | POA: Diagnosis not present

## 2022-11-19 NOTE — Progress Notes (Signed)
Complete physical exam  Patient: Norman Rivera   DOB: 02-22-1961   61 y.o. Male  MRN: [id number]  Subjective:    Chief Complaint  Patient presents with   Annual Exam    Norman Rivera is a 61 y.o. male who presents today for a complete physical exam. He reports consuming a general diet.  Yoga 2 days per week   He generally feels well. Marland Kitchen He does not have additional problems to discuss today. + picky eater.    Most recent fall risk assessment:    11/19/2022    8:33 AM  Fall Risk   Falls in the past year? 0  Number falls in past yr: 0  Injury with Fall? 0  Risk for fall due to : No Fall Risks  Follow up Follow up appointment     Most recent depression screenings:    11/19/2022    8:34 AM 11/13/2021    8:42 AM  PHQ 2/9 Scores  PHQ - 2 Score 0 0        Patient Care Team: Agapito Games, MD as PCP - General   No outpatient medications prior to visit.   No facility-administered medications prior to visit.    ROS        Objective:     BP 127/70 (BP Location: Left Arm, Patient Position: Sitting, Cuff Size: Normal)   Pulse 74   Ht 5\' 9"  (1.753 m)   Wt 179 lb 0.6 oz (81.2 kg)   SpO2 97%   BMI 26.44 kg/m    Physical Exam Constitutional:      Appearance: He is well-developed.  HENT:     Head: Normocephalic and atraumatic.     Right Ear: Tympanic membrane, ear canal and external ear normal.     Left Ear: Tympanic membrane, ear canal and external ear normal.     Nose: Nose normal.     Mouth/Throat:     Pharynx: Oropharynx is clear.  Eyes:     Conjunctiva/sclera: Conjunctivae normal.     Pupils: Pupils are equal, round, and reactive to light.  Neck:     Thyroid: No thyromegaly.  Cardiovascular:     Rate and Rhythm: Normal rate and regular rhythm.     Heart sounds: Normal heart sounds.  Pulmonary:     Effort: Pulmonary effort is normal.     Breath sounds: Normal breath sounds.  Abdominal:     General: Bowel sounds are normal. There is no  distension.     Palpations: Abdomen is soft. There is no mass.     Tenderness: There is no abdominal tenderness. There is no guarding or rebound.  Musculoskeletal:        General: Normal range of motion.     Cervical back: Normal range of motion and neck supple. No tenderness.  Lymphadenopathy:     Cervical: No cervical adenopathy.  Skin:    General: Skin is warm and dry.  Neurological:     Mental Status: He is alert and oriented to person, place, and time.     Deep Tendon Reflexes: Reflexes are normal and symmetric.  Psychiatric:        Behavior: Behavior normal.        Thought Content: Thought content normal.        Judgment: Judgment normal.      No results found for any visits on 11/19/22.     Assessment & Plan:    Routine Health Maintenance and Physical  Exam  Immunization History  Administered Date(s) Administered   Influenza,inj,Quad PF,6+ Mos 11/05/2019, 11/07/2020, 11/13/2021   PFIZER(Purple Top)SARS-COV-2 Vaccination 03/24/2020, 04/18/2020, 10/21/2020   Tdap 06/10/2018    Health Maintenance  Topic Date Due   Zoster Vaccines- Shingrix (1 of 2) 02/13/2023 (Originally 05/02/2012)   INFLUENZA VACCINE  03/31/2023 (Originally 07/31/2022)   COVID-19 Vaccine (4 - Pfizer series) 11/30/2023 (Originally 12/16/2020)   Fecal DNA (Cologuard)  07/12/2023   Hepatitis C Screening  Completed   HIV Screening  Completed   Pneumococcal Vaccine 54-44 Years old  Aged Out   HPV VACCINES  Aged Out    Discussed health benefits of physical activity, and encouraged him to engage in regular exercise appropriate for his age and condition.  Problem List Items Addressed This Visit   None Visit Diagnoses     Wellness examination    -  Primary   Relevant Orders   PSA   Lipid panel   COMPLETE METABOLIC PANEL WITH GFR   CBC   Screening for prostate cancer       Relevant Orders   PSA       Keep up a regular exercise program and make sure you are eating a healthy diet Try to eat 4  servings of dairy a day, or if you are lactose intolerant take a calcium with vitamin D daily.  Your vaccines are up to date.   Return in about 1 year (around 11/20/2023) for Wellness Exam.     Nani Gasser, MD

## 2022-11-20 LAB — COMPLETE METABOLIC PANEL WITH GFR
AG Ratio: 1.9 (calc) (ref 1.0–2.5)
ALT: 17 U/L (ref 9–46)
AST: 14 U/L (ref 10–35)
Albumin: 4.7 g/dL (ref 3.6–5.1)
Alkaline phosphatase (APISO): 106 U/L (ref 35–144)
BUN: 12 mg/dL (ref 7–25)
CO2: 31 mmol/L (ref 20–32)
Calcium: 9.7 mg/dL (ref 8.6–10.3)
Chloride: 101 mmol/L (ref 98–110)
Creat: 1.06 mg/dL (ref 0.70–1.35)
Globulin: 2.5 g/dL (calc) (ref 1.9–3.7)
Glucose, Bld: 85 mg/dL (ref 65–99)
Potassium: 4.4 mmol/L (ref 3.5–5.3)
Sodium: 138 mmol/L (ref 135–146)
Total Bilirubin: 1.2 mg/dL (ref 0.2–1.2)
Total Protein: 7.2 g/dL (ref 6.1–8.1)
eGFR: 80 mL/min/{1.73_m2} (ref >=60)

## 2022-11-20 LAB — CBC
HCT: 44.9 % (ref 38.5–50.0)
Hemoglobin: 15.5 g/dL (ref 13.2–17.1)
MCH: 30.4 pg (ref 27.0–33.0)
MCHC: 34.5 g/dL (ref 32.0–36.0)
MCV: 88 fL (ref 80.0–100.0)
MPV: 11.8 fL (ref 7.5–12.5)
Platelets: 198 10*3/uL (ref 140–400)
RBC: 5.1 10*6/uL (ref 4.20–5.80)
RDW: 12.5 % (ref 11.0–15.0)
WBC: 6.2 10*3/uL (ref 3.8–10.8)

## 2022-11-20 LAB — LIPID PANEL
Cholesterol: 188 mg/dL (ref <200)
HDL: 58 mg/dL (ref >=40)
LDL Cholesterol (Calc): 113 mg/dL (calc) — ABNORMAL HIGH
Non-HDL Cholesterol (Calc): 130 mg/dL (calc) — ABNORMAL HIGH (ref <130)
Total CHOL/HDL Ratio: 3.2 (calc) (ref <5.0)
Triglycerides: 74 mg/dL (ref <150)

## 2022-11-20 LAB — PSA: PSA: 0.61 ng/mL (ref <=4.00)

## 2022-11-20 NOTE — Progress Notes (Signed)
HI Norman Rivera,  LDL looks better this time, great work!  Optimally LDL under 100 so it is getting close.  All other labs look great.  Prostate test is normal.

## 2023-07-23 ENCOUNTER — Ambulatory Visit: Payer: 59 | Admitting: Family Medicine

## 2023-08-13 ENCOUNTER — Ambulatory Visit: Payer: 59 | Admitting: Family Medicine

## 2023-08-15 ENCOUNTER — Ambulatory Visit: Payer: 59 | Admitting: Family Medicine

## 2023-08-16 ENCOUNTER — Ambulatory Visit: Payer: 59 | Admitting: Family Medicine

## 2023-08-16 ENCOUNTER — Encounter: Payer: Self-pay | Admitting: Family Medicine

## 2023-08-16 VITALS — BP 126/69 | HR 71 | Ht 69.0 in | Wt 158.0 lb

## 2023-08-16 DIAGNOSIS — F419 Anxiety disorder, unspecified: Secondary | ICD-10-CM | POA: Diagnosis not present

## 2023-08-16 DIAGNOSIS — Z6379 Other stressful life events affecting family and household: Secondary | ICD-10-CM | POA: Diagnosis not present

## 2023-08-16 MED ORDER — HYDROXYZINE PAMOATE 25 MG PO CAPS
25.0000 mg | ORAL_CAPSULE | Freq: Three times a day (TID) | ORAL | 0 refills | Status: AC | PRN
Start: 2023-08-16 — End: ?

## 2023-08-16 MED ORDER — ESCITALOPRAM OXALATE 10 MG PO TABS: ORAL_TABLET | ORAL | 1 refills | Status: DC

## 2023-08-16 NOTE — Progress Notes (Signed)
   Established Patient Office Visit  Subjective   Patient ID: Norman Rivera, male    DOB: Dec 18, 1961  Age: 62 y.o. MRN: [id number]  Chief Complaint  Patient presents with   Follow-up         HPI  Norman Rivera is here today to dicuss mood.  He lost his job of 21 years 14 weeks ago and he has felt really down and more anxious than usual. Had a panic attack recently. Reports not sleeping well. Has lost 20 lbs. Has put out 300 online applications for jobs and hasn't heard back. Not on any medications. Open to talking with someone.  Reports no thoughts of harming self.  Reports significant anxiety about finances etc. No longer feels like financial contributor to family.    Flowsheet Row Office Visit from 08/16/2023 in [facility] at [facility]  PHQ-9 Total Score 16         08/16/2023   12:16 PM 11/05/2019    9:00 AM  GAD 7 : Generalized Anxiety Score  Nervous, Anxious, on Edge 3 0  Control/stop worrying 2 0  Worry too much - different things 2 0  Trouble relaxing 3 0  Restless 1   Easily annoyed or irritable 1 0  Afraid - awful might happen 2 0  Total GAD 7 Score 14   Anxiety Difficulty Somewhat difficult Not difficult at all        ROS    Objective:     BP 126/69   Pulse 71   Ht 5\' 9"  (1.753 m)   Wt 158 lb (71.7 kg)   SpO2 98%   BMI 23.33 kg/m    Physical Exam Vitals reviewed.  Constitutional:      Appearance: He is well-developed.  HENT:     Head: Normocephalic and atraumatic.  Eyes:     Conjunctiva/sclera: Conjunctivae normal.  Cardiovascular:     Rate and Rhythm: Normal rate.  Pulmonary:     Effort: Pulmonary effort is normal.  Skin:    General: Skin is dry.     Coloration: Skin is not pale.  Neurological:     Mental Status: He is alert and oriented to person, place, and time.  Psychiatric:        Behavior: Behavior normal.      No results found for any visits on 08/16/23.    The 10-year ASCVD risk score  (Arnett DK, et al., 2019) is: 7.8%    Assessment & Plan:   Problem List Items Addressed This Visit       Other   Stressful life event affecting family - Primary   Relevant Orders   Ambulatory referral to Behavioral Health   Anxiety   Relevant Medications   escitalopram (LEXAPRO) 10 MG tablet   hydrOXYzine (VISTARIL) 25 MG capsule    Discussed options. Will start SSRI. F/U in 2 weeks.  Use vistaril PRN for anxiety and sleep for now.  Refer for formal therapy.    I spent 25 minutes on the day of the encounter to include pre-visit record review, face-to-face time with the patient and post visit ordering of test.   Return in about 3 weeks (around 09/06/2023) for New start medication.    Nani Gasser, MD

## 2023-08-27 ENCOUNTER — Ambulatory Visit (INDEPENDENT_AMBULATORY_CARE_PROVIDER_SITE_OTHER): Payer: 59 | Admitting: Professional

## 2023-08-27 ENCOUNTER — Encounter: Payer: Self-pay | Admitting: Professional

## 2023-08-27 DIAGNOSIS — F419 Anxiety disorder, unspecified: Secondary | ICD-10-CM | POA: Diagnosis not present

## 2023-08-27 DIAGNOSIS — F4321 Adjustment disorder with depressed mood: Secondary | ICD-10-CM

## 2023-08-27 NOTE — Progress Notes (Addendum)
[facility] Behavioral Health Counselor Initial Adult Exam  Name: Norman Rivera Date: 08/27/2023 MRN: [id number] DOB: 1961/01/01 PCP: Agapito Games, MD  Time spent: 42 minutes 1159am-1241pm  Guardian/Payee:  self    Paperwork requested: No   Reason for Visit /Presenting Problem: This session was held via video teletherapy. The patient consented to video teletherapy and was located in his home during this session. He is aware it is the responsibility of the patient to secure confidentiality on his end of the session. The provider was in a private home office for the duration of this session.    The patient arrived on time for his Caregility appointment.  The patient reports 3.5 months ago he was RIF'd after 21 years with the same company. He has "overwhelming anxiety and somewhat depression". He is still job hunting and "that is a miserable experience too". He is really not himself. He has had a lot of trouble sleeping that began with the layoff. He seldom had trouble with sleep prior to his layoff.  Mental Status Exam: Appearance:   Casual     Behavior:  Sharing  Motor:  Normal  Speech/Language:   Clear and Coherent and Normal Rate  Affect:  Depressed  Mood:  anxious  Thought process:  goal directed  Thought content:    WNL  Sensory/Perceptual disturbances:    WNL  Orientation:  oriented to person, place, time/date, and situation  Attention:  Good  Concentration:  Good  Memory:  WNL  Fund of knowledge:   Good  Insight:    Good  Judgment:   Good  Impulse Control:  Good   Risk Assessment: Danger to Self:  No Self-injurious Behavior: No Danger to Others: No Duty to Warn:no Physical Aggression / Violence:No  Access to Firearms a concern: No  Gang Involvement:No  Patient / guardian was educated about steps to take if suicide or homicide risk level increases between visits: n/a While future psychiatric events cannot be accurately predicted, the patient does not currently  require acute inpatient psychiatric care and does not currently meet [facility] involuntary commitment criteria.  Substance Abuse History: Current substance abuse: No     Past Psychiatric History:   No previous psychological problems have been observed Outpatient Providers:none History of Psych Hospitalization: No  Psychological Testing: none   Abuse History:  Victim of: No.,    Report needed: No. Victim of Neglect:No. Perpetrator of none  Witness / Exposure to Domestic Violence: No   Protective Services Involvement: No  Witness to [employer] Violence:  No   Family History:  Family History  Problem Relation Age of Onset   Hypertension Father    Peripheral vascular disease Father   His father died last summer. He thinks he is doing okay. He spoke at his eulogy and that was one of his proudest moments.  Living situation: the patient lives with their spouse and Judeth Cornfield  Sexual Orientation: Straight  Relationship Status: married 28 years after dating for one year. Name of spouse / other: [name] If a parent, number of children / ages: two sons, Pennie Rushing 59 and Ayesha Rumpf 24  Support Systems: spouse friends  Surveyor, quantity Stress:  Yes , lost his ability to earn a living; his wife is doing well.  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 History of legal issue / charges: none  Medical History/Surgical History: reviewed History reviewed. No pertinent past medical history.  Past Surgical History:  Procedure Laterality Date   HERNIA REPAIR  1974   as a child  Tonsils out as child R meniscus not corrected  Medications: Current Outpatient Medications  Medication Sig Dispense Refill   escitalopram (LEXAPRO) 10 MG tablet Take 0.5 tablets (5 mg total) by mouth daily for 8 days, THEN 1 tablet (10 mg total) daily for 22 days. 30 tablet 1   hydrOXYzine (VISTARIL) 25 MG capsule Take 1 capsule (25 mg total) by mouth every 8 (eight) hours as needed. 30 capsule 0   No current facility-administered medications for this visit.    No Known Allergies  Diagnoses:  Anxiety  Adjustment disorder with depressed mood  Plan of Care:  -how to turn off thoughts, dwelling, how to better manage depression "to get back to the old me" -contact insurance to confirm benefits to determine how frequently he can afford to be seen -come prepared to discuss what he wants and needs from therapy; develop treatment plan -next session with be Wednesday, September 11, 2023 at 10am

## 2023-08-27 NOTE — Progress Notes (Signed)
° ° ° ° ° ° ° ° ° ° ° ° ° ° °  Kathy Collins, LCMHC °

## 2023-08-30 ENCOUNTER — Ambulatory Visit: Payer: 59 | Admitting: Family Medicine

## 2023-09-06 ENCOUNTER — Encounter: Payer: Self-pay | Admitting: Family Medicine

## 2023-09-06 ENCOUNTER — Ambulatory Visit: Payer: 59 | Admitting: Family Medicine

## 2023-09-06 VITALS — BP 132/66 | HR 73 | Ht 69.0 in | Wt 158.0 lb

## 2023-09-06 DIAGNOSIS — F419 Anxiety disorder, unspecified: Secondary | ICD-10-CM

## 2023-09-06 DIAGNOSIS — Z6379 Other stressful life events affecting family and household: Secondary | ICD-10-CM | POA: Diagnosis not present

## 2023-09-06 MED ORDER — HYDROXYZINE HCL 10 MG PO TABS: 10.0000 mg | ORAL_TABLET | Freq: Two times a day (BID) | ORAL | 2 refills | Status: DC | PRN

## 2023-09-06 MED ORDER — ESCITALOPRAM OXALATE 10 MG PO TABS: 10.0000 mg | ORAL_TABLET | Freq: Every day | ORAL | 1 refills | Status: DC

## 2023-09-06 NOTE — Progress Notes (Signed)
Pt doing well on current regimen. He would like to change the dosage of the Hyroxyzine 25 mg to 10 mg

## 2023-09-06 NOTE — Progress Notes (Signed)
   Established Patient Office Visit  Subjective   Patient ID: Norman Rivera, male    DOB: [birth year]  Age: 62 y.o. MRN: [id number]  Chief Complaint  Patient presents with   mood    HPI   Here today for follow-up for new start Lexapro for acute situational stress.  He still on the job hunt in fact he put in 20 resumes today.  He does feel like the Lexapro has been helpful.  His PHQ-9 score went from 16 down to 5 today.  GAD-7 score went from 14 down to 3 today.  He does feel like he is had more dry mouth.  He does feel like the decrease stress has actually helped with his bowels.  He would like to consider decreasing the dose of hydroxyzine from 25 mg to 10 mg.  He says if he takes it a couple of days in the row he just feels bad most like it is accumulating in his system.   ROS    Objective:     BP 132/66   Pulse 73   Ht 5\' 9"  (1.753 m)   Wt 158 lb (71.7 kg)   SpO2 98%   BMI 23.33 kg/m    Physical Exam Vitals and nursing note reviewed.  Constitutional:      Appearance: Normal appearance.  HENT:     Head: Normocephalic and atraumatic.  Eyes:     Conjunctiva/sclera: Conjunctivae normal.  Cardiovascular:     Rate and Rhythm: Normal rate and regular rhythm.  Pulmonary:     Effort: Pulmonary effort is normal.     Breath sounds: Normal breath sounds.  Skin:    General: Skin is warm and dry.  Neurological:     Mental Status: He is alert.  Psychiatric:        Mood and Affect: Mood normal.      No results found for any visits on 09/06/23.    The 10-year ASCVD risk score (Arnett DK, et al., 2019) is: 8.4%    Assessment & Plan:   Problem List Items Addressed This Visit       Other   Stressful life event affecting family - Primary    Well with Lexapro 10 mg continue current regimen we will decrease dose of the hydroxyzine to 10 mg to use as needed he is mostly using it at night to help him sleep.  If he is otherwise doing well plan to follow-up in 3 months.  Make  sure to stay well-hydrated since it is causing a little bit of dry mouth.  We discussed typical treatment course and tapering when he does get to a place that he is ready to discontinue the medication.      Anxiety   Relevant Medications   hydrOXYzine (ATARAX) 10 MG tablet   escitalopram (LEXAPRO) 10 MG tablet    Return in about 3 months (around 12/06/2023) for Mood.    Nani Gasser, MD

## 2023-09-06 NOTE — Assessment & Plan Note (Signed)
Well with Lexapro 10 mg continue current regimen we will decrease dose of the hydroxyzine to 10 mg to use as needed he is mostly using it at night to help him sleep.  If he is otherwise doing well plan to follow-up in 3 months.  Make sure to stay well-hydrated since it is causing a little bit of dry mouth.  We discussed typical treatment course and tapering when he does get to a place that he is ready to discontinue the medication.

## 2023-09-11 ENCOUNTER — Encounter: Payer: Self-pay | Admitting: Professional

## 2023-09-11 ENCOUNTER — Ambulatory Visit (INDEPENDENT_AMBULATORY_CARE_PROVIDER_SITE_OTHER): Payer: 59 | Admitting: Professional

## 2023-09-11 DIAGNOSIS — F4321 Adjustment disorder with depressed mood: Secondary | ICD-10-CM | POA: Diagnosis not present
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
## 2023-09-11 NOTE — Progress Notes (Signed)
° ° ° ° ° ° ° ° ° ° ° ° ° ° °  Kathy Collins, LCMHC °

## 2023-09-17 ENCOUNTER — Encounter: Payer: Self-pay | Admitting: Family Medicine

## 2023-09-17 DIAGNOSIS — Z1211 Encounter for screening for malignant neoplasm of colon: Secondary | ICD-10-CM

## 2023-09-18 NOTE — Telephone Encounter (Signed)
Ordered

## 2023-09-25 ENCOUNTER — Ambulatory Visit: Payer: 59 | Admitting: Professional

## 2023-09-25 ENCOUNTER — Encounter: Payer: Self-pay | Admitting: Professional

## 2023-09-25 DIAGNOSIS — F4321 Adjustment disorder with depressed mood: Secondary | ICD-10-CM | POA: Diagnosis not present

## 2023-09-25 DIAGNOSIS — F411 Generalized anxiety disorder: Secondary | ICD-10-CM
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
## 2023-09-28 ENCOUNTER — Other Ambulatory Visit: Payer: Self-pay | Admitting: Family Medicine

## 2023-09-28 LAB — COLOGUARD: COLOGUARD: NEGATIVE

## 2023-09-30 NOTE — Progress Notes (Signed)
Great news! Your Cologuard test is negative.  Recommend repeat colon cancer screening in 3 years.

## 2023-10-09 ENCOUNTER — Ambulatory Visit: Payer: 59 | Admitting: Professional

## 2023-11-25 ENCOUNTER — Encounter: Payer: 59 | Admitting: Family Medicine

## 2023-11-27 ENCOUNTER — Encounter: Payer: 59 | Admitting: Family Medicine

## 2023-12-05 ENCOUNTER — Encounter: Payer: Self-pay | Admitting: Family Medicine

## 2023-12-05 ENCOUNTER — Ambulatory Visit (INDEPENDENT_AMBULATORY_CARE_PROVIDER_SITE_OTHER): Payer: 59 | Admitting: Family Medicine

## 2023-12-05 VITALS — BP 146/84 | HR 71 | Resp 14 | Ht 69.0 in | Wt 165.0 lb

## 2023-12-05 DIAGNOSIS — M23206 Derangement of unspecified meniscus due to old tear or injury, right knee: Secondary | ICD-10-CM

## 2023-12-05 DIAGNOSIS — S83206A Unspecified tear of unspecified meniscus, current injury, right knee, initial encounter: Secondary | ICD-10-CM | POA: Insufficient documentation

## 2023-12-05 DIAGNOSIS — Z23 Encounter for immunization: Secondary | ICD-10-CM

## 2023-12-05 DIAGNOSIS — Z0000 Encounter for general adult medical examination without abnormal findings: Secondary | ICD-10-CM | POA: Diagnosis not present

## 2023-12-05 MED ORDER — MELOXICAM 15 MG PO TABS: 15.0000 mg | ORAL_TABLET | Freq: Every day | ORAL | 1 refills | Status: DC | PRN

## 2023-12-05 NOTE — Patient Instructions (Signed)
Ok to cut lexapro in half and take half daily for 10 days and then half a tab every other day for 8 days and then stop.

## 2023-12-05 NOTE — Progress Notes (Signed)
Complete physical exam  Patient: Norman Rivera   DOB: 12-Aug-1961   62 y.o. Male  MRN: [id number]  Subjective:    Chief Complaint  Patient presents with  . Annual Exam    Norman Rivera is a 62 y.o. male who presents today for a complete physical exam. He reports consuming a general diet. He generally feels well. He does have additional problems to discuss today.   He did want a refill of meloxicam which he uses for his right knee he has a torn meniscus and never ended up having surgery for it so he just uses it occasionally but is out of his old prescription.  Has a new job and is doing well.  Is a little bit more stressful and high paced.  But he would like to go ahead and taper off of the Lexapro.  Interested in giving his flu shot and his first shingles vaccine today.   Most recent fall risk assessment:    08/16/2023   12:15 PM  Fall Risk   Falls in the past year? 0  Number falls in past yr: 0  Injury with Fall? 0  Risk for fall due to : No Fall Risks  Follow up Falls evaluation completed     Most recent depression screenings:    09/06/2023    4:35 PM 08/16/2023   12:15 PM  PHQ 2/9 Scores  PHQ - 2 Score 2 4  PHQ- 9 Score 5 16        Patient Care Team: Agapito Games, MD as PCP - General   Outpatient Medications Prior to Visit  Medication Sig  . escitalopram (LEXAPRO) 10 MG tablet Take 1 tablet (10 mg total) by mouth daily.  . hydrOXYzine (ATARAX) 10 MG tablet TAKE 1 TABLET BY MOUTH 2 TIMES DAILY AS NEEDED.   No facility-administered medications prior to visit.    ROS        Objective:     BP (!) 146/84   Pulse 71   Resp 14   Ht 5\' 9"  (1.753 m)   Wt 165 lb 0.6 oz (74.9 kg)   SpO2 98%   BMI 24.37 kg/m    Physical Exam   No results found for any visits on 12/05/23.     Assessment & Plan:    Routine Health Maintenance and Physical Exam  Immunization History  Administered Date(s) Administered  . Fluad Trivalent(High Dose 65+)  12/05/2023  . Influenza,inj,Quad PF,6+ Mos 11/05/2019, 11/07/2020, 11/13/2021  . PFIZER(Purple Top)SARS-COV-2 Vaccination 03/24/2020, 04/18/2020, 10/21/2020  . Tdap 06/10/2018  . Zoster Recombinant(Shingrix) 12/05/2023    Health Maintenance  Topic Date Due  . COVID-19 Vaccine (4 - 2023-24 season) 09/01/2023  . Zoster Vaccines- Shingrix (2 of 2) 01/30/2024  . Fecal DNA (Cologuard)  09/21/2026  . DTaP/Tdap/Td (2 - Td or Tdap) 06/10/2028  . INFLUENZA VACCINE  Completed  . Hepatitis C Screening  Completed  . HIV Screening  Completed  . Pneumococcal Vaccine 48-70 Years old  Aged Out  . HPV VACCINES  Aged Out    Discussed health benefits of physical activity, and encouraged him to engage in regular exercise appropriate for his age and condition.  Problem List Items Addressed This Visit       Musculoskeletal and Integument   Right knee meniscal tear   Relevant Medications   meloxicam (MOBIC) 15 MG tablet   Other Visit Diagnoses     Encounter for immunization    -  Primary  Relevant Orders   Varicella-zoster vaccine IM (Completed)   Flu Vaccine Trivalent High Dose (Fluad) (Completed)   Wellness examination       Relevant Orders   CMP14+EGFR   Lipid panel   CBC      F/U for  6 months for second shingles vaccine.  Return in about 1 year (around 12/04/2024) for Wellness Exam.     Nani Gasser, MD

## 2023-12-06 ENCOUNTER — Ambulatory Visit: Payer: 59 | Admitting: Family Medicine

## 2023-12-06 LAB — CMP14+EGFR
ALT: 18 [IU]/L (ref 0–44)
AST: 21 [IU]/L (ref 0–40)
Albumin: 4.5 g/dL (ref 3.9–4.9)
Alkaline Phosphatase: 118 [IU]/L (ref 44–121)
BUN/Creatinine Ratio: 13 (ref 10–24)
BUN: 13 mg/dL (ref 8–27)
Bilirubin Total: 0.6 mg/dL (ref 0.0–1.2)
CO2: 25 mmol/L (ref 20–29)
Calcium: 9.5 mg/dL (ref 8.6–10.2)
Chloride: 99 mmol/L (ref 96–106)
Creatinine, Ser: 1.01 mg/dL (ref 0.76–1.27)
Globulin, Total: 2.3 g/dL (ref 1.5–4.5)
Glucose: 81 mg/dL (ref 70–99)
Potassium: 4.2 mmol/L (ref 3.5–5.2)
Sodium: 139 mmol/L (ref 134–144)
Total Protein: 6.8 g/dL (ref 6.0–8.5)
eGFR: 85 mL/min/{1.73_m2} (ref >59)

## 2023-12-06 LAB — LIPID PANEL
Chol/HDL Ratio: 3.1 {ratio} (ref 0.0–5.0)
Cholesterol, Total: 190 mg/dL (ref 100–199)
HDL: 61 mg/dL (ref >39)
LDL Chol Calc (NIH): 109 mg/dL — ABNORMAL HIGH (ref 0–99)
Triglycerides: 113 mg/dL (ref 0–149)
VLDL Cholesterol Cal: 20 mg/dL (ref 5–40)

## 2023-12-06 LAB — CBC
Hematocrit: 45 % (ref 37.5–51.0)
Hemoglobin: 14.9 g/dL (ref 13.0–17.7)
MCH: 30.7 pg (ref 26.6–33.0)
MCHC: 33.1 g/dL (ref 31.5–35.7)
MCV: 93 fL (ref 79–97)
Platelets: 204 10*3/uL (ref 150–450)
RBC: 4.85 x10E6/uL (ref 4.14–5.80)
RDW: 12.3 % (ref 11.6–15.4)
WBC: 6.7 10*3/uL (ref 3.4–10.8)

## 2023-12-06 NOTE — Progress Notes (Signed)
Hi Norman Rivera, LDL cholesterol is 109 just a little borderline.  Just continue to work on healthy diet and try to get in some routine exercise.  Your blood count and metabolic panel are normal.

## 2023-12-12 ENCOUNTER — Ambulatory Visit: Payer: 59 | Admitting: Family Medicine

## 2024-05-22 ENCOUNTER — Ambulatory Visit (INDEPENDENT_AMBULATORY_CARE_PROVIDER_SITE_OTHER): Payer: 59

## 2024-05-22 ENCOUNTER — Ambulatory Visit: Payer: 59 | Admitting: Family Medicine

## 2024-05-22 VITALS — BP 150/78 | HR 72 | Ht 69.0 in | Wt 170.0 lb

## 2024-05-22 DIAGNOSIS — Z23 Encounter for immunization: Secondary | ICD-10-CM | POA: Diagnosis not present

## 2024-05-22 NOTE — Progress Notes (Unsigned)
Pt presents to day for immunization. No allergy to eggs or latex.   Location: RD  Pt tolerated well.   

## 2024-06-16 ENCOUNTER — Other Ambulatory Visit: Payer: Self-pay | Admitting: Family Medicine

## 2024-06-16 DIAGNOSIS — M23206 Derangement of unspecified meniscus due to old tear or injury, right knee: Secondary | ICD-10-CM

## 2024-11-04 ENCOUNTER — Encounter: Payer: Self-pay | Admitting: Family Medicine

## 2024-11-04 MED ORDER — NYSTATIN POWD: 1.0000 | Freq: Two times a day (BID) | 1 refills | Status: DC | PRN

## 2024-11-04 MED ORDER — KETOCONAZOLE 2 % EX CREA: 1.0000 | TOPICAL_CREAM | Freq: Two times a day (BID) | CUTANEOUS | 1 refills | Status: DC

## 2024-11-10 MED ORDER — KETOCONAZOLE 2 % EX CREA: 1.0000 | TOPICAL_CREAM | Freq: Two times a day (BID) | CUTANEOUS | 1 refills | Status: AC

## 2024-11-10 NOTE — Addendum Note (Signed)
 Addended by: Reeta Kuk P on: 11/10/2024 08:59 AM   Modules accepted: Orders

## 2024-11-10 NOTE — Telephone Encounter (Signed)
 Forwarding message to [place]olog[place] , [place] covering Dr. Alvan   Please see message from patient Requesting rx rf of Ketoconazole 2% cream  Last written 11/04/2024 with one refill  Spoke with pharmacy and they would not allow early refill but out of pocket cost through [facility] is $19.33  States was covering a very large area with cream  ( bilateral upper legs) and this is why he is using so much initially and the tube was a small tube .   Patient does state that if he feels he needs refill before the time allotted for refill he will use [facility].

## 2024-12-08 ENCOUNTER — Encounter: Payer: Self-pay | Admitting: Family Medicine

## 2024-12-08 ENCOUNTER — Ambulatory Visit (INDEPENDENT_AMBULATORY_CARE_PROVIDER_SITE_OTHER): Payer: 59 | Admitting: Family Medicine

## 2024-12-08 VITALS — BP 118/62 | HR 76 | Ht 69.0 in | Wt 176.0 lb

## 2024-12-08 DIAGNOSIS — Z0000 Encounter for general adult medical examination without abnormal findings: Secondary | ICD-10-CM

## 2024-12-08 DIAGNOSIS — Z125 Encounter for screening for malignant neoplasm of prostate: Secondary | ICD-10-CM

## 2024-12-08 DIAGNOSIS — Z23 Encounter for immunization: Secondary | ICD-10-CM

## 2024-12-08 NOTE — Progress Notes (Signed)
 Complete physical exam  Patient: Norman Rivera    DOB: [birth year] 63 y.o.   MRN: [id number]  Chief Complaint  Patient presents with   Annual Exam    Subjective:    Norman Rivera is a 63 y.o. male who presents today for a complete physical exam. He reports consuming a general diet. The patient has a physically strenuous job, but has no regular exercise apart from work.  He generally feels well. He reports sleeping fairly well. He does not have additional problems to discuss today.   Discussed the use of AI scribe software for clinical note transcription with the patient, who gave verbal consent to proceed.  History of Present Illness Norman Rivera is a 63 year old male who presents for an annual physical exam.  Cardiovascular and respiratory symptoms - No recent chest pain - No shortness of breath - Occasionally checks blood pressure at home; noted slight elevation last December  Cutaneous lesions - Skin tag on chest, sometimes pruritic but not bothersome - Perceived slight increase in size - No concern for malignancy  Dermatologic symptoms - Rash developed after returning from vacation, attributed to sweating - Rash is improving  Psychological well-being - No longer taking Lexapro , which was used temporarily - Current job is demanding but provides more social interaction than previous remote work  Physical activity and lifestyle - Works long hours, starting early and returning home in the evening - Engages in physical activity at work and on weekends - Enjoys attending heavy rock concerts with family   Most recent fall risk assessment:    12/08/2024    4:01 PM  Fall Risk   Falls in the past year? 0  Number falls in past yr: 0  Injury with Fall? 0  Risk for fall due to : No Fall Risks  Follow up Falls evaluation completed     Most recent depression screenings:    12/08/2024    4:01 PM 09/06/2023    4:35 PM  PHQ 2/9 Scores  PHQ - 2 Score 0 2  PHQ- 9 Score 0 5       Data saved with a previous flowsheet row definition        Patient Care Team: Alvan Dorothyann BIRCH, MD as PCP - General   ROS    Objective:    BP 118/62   Pulse 76   Ht 5' 9 (1.753 m)   Wt 176 lb 0.6 oz (79.9 kg)   SpO2 100%   BMI 26.00 kg/m     Physical Exam Constitutional:      Appearance: Normal appearance.  HENT:     Head: Normocephalic and atraumatic.     Right Ear: Tympanic membrane, ear canal and external ear normal.     Left Ear: Tympanic membrane, ear canal and external ear normal.     Nose: Nose normal.     Mouth/Throat:     Pharynx: Oropharynx is clear.  Eyes:     Extraocular Movements: Extraocular movements intact.     Conjunctiva/sclera: Conjunctivae normal.     Pupils: Pupils are equal, round, and reactive to light.  Neck:     Thyroid: No thyromegaly.     Vascular: No carotid bruit.  Cardiovascular:     Rate and Rhythm: Normal rate and regular rhythm.  Pulmonary:     Effort: Pulmonary effort is normal.     Breath sounds: Normal breath sounds.  Abdominal:     General: Bowel sounds are normal.  Palpations: Abdomen is soft.     Tenderness: There is no abdominal tenderness.  Musculoskeletal:        General: No swelling.     Cervical back: Neck supple.  Skin:    General: Skin is warm and dry.     Comments: Large seb K on right upper chest wall.   Neurological:     Mental Status: He is oriented to person, place, and time.  Psychiatric:        Mood and Affect: Mood normal.        Behavior: Behavior normal.       No results found for any visits on 12/08/24.       Assessment & Plan:    Routine Health Maintenance and Physical Exam Immunization History  Administered Date(s) Administered   Fluad Trivalent(High Dose 65+) 12/05/2023   Influenza, Seasonal, Injecte, Preservative Fre 12/08/2024   Influenza,inj,Quad PF,6+ Mos 11/05/2019, 11/07/2020, 11/13/2021   PFIZER(Purple Top)SARS-COV-2 Vaccination 03/24/2020, 04/18/2020, 10/21/2020    Tdap 06/10/2018   Zoster Recombinant(Shingrix ) 12/05/2023, 05/22/2024    Health Maintenance  Topic Date Due   Pneumococcal Vaccine: 50+ Years (1 of 1 - PCV) Never done   COVID-19 Vaccine (4 - 2025-26 season) 08/31/2024   Fecal DNA (Cologuard)  09/21/2026   DTaP/Tdap/Td (2 - Td or Tdap) 06/10/2028   Influenza Vaccine  Completed   Hepatitis C Screening  Completed   HIV Screening  Completed   Zoster Vaccines- Shingrix   Completed   Hepatitis B Vaccines 19-59 Average Risk  Aged Out   HPV VACCINES  Aged Out   Meningococcal B Vaccine  Aged Out    Discussed health benefits of physical activity, and encouraged him to engage in regular exercise appropriate for his age and condition.  Problem List Items Addressed This Visit   None Visit Diagnoses       Wellness examination    -  Primary   Relevant Orders   Lipid Panel With LDL/HDL Ratio   CMP14+EGFR   CBC   PSA     Encounter for immunization       Relevant Orders   Flu vaccine trivalent PF, 6mos and older(Flulaval,Afluria,Fluarix,Fluzone) (Completed)     Screening for malignant neoplasm of prostate       Relevant Orders   PSA       Assessment and Plan Assessment & Plan Seborrheic keratosis of chest Seborrheic keratosis on chest, possibly slightly enlarged. No malignancy signs. Differential includes benign skin lesion. - Consider shave biopsy if lesion changes or for reassurance. - Discuss potential removal with spouse.  Elevated blood pressure Blood pressure elevated last December. Current reading may be stress-influenced. - Rechecked blood pressure during visit. - Encouraged home blood pressure monitoring after resting. - Aim for blood pressure <130/85 mmHg.  General Health Maintenance Discussed regular exercise, resistance training, cardio, and new pneumonia vaccination guidelines for those 50+. - Encouraged regular exercise, including resistance training and cardio. - Recommended tracking daily physical  activity. - Discussed Prevnar 20 vaccine option; may receive at pharmacy and inform office for documentation. - Ordered routine blood work: kidney, liver, blood salts, cholesterol, PSA if due.    No follow-ups on file.    Dorothyann Byars, MD [facility]

## 2024-12-26 LAB — CMP14+EGFR
ALT: 30 IU/L (ref 0–44)
AST: 27 IU/L (ref 0–40)
Albumin: 4.4 g/dL (ref 3.9–4.9)
Alkaline Phosphatase: 109 IU/L (ref 47–123)
BUN/Creatinine Ratio: 13 (ref 10–24)
BUN: 13 mg/dL (ref 8–27)
Bilirubin Total: 0.9 mg/dL (ref 0.0–1.2)
CO2: 24 mmol/L (ref 20–29)
Calcium: 9.3 mg/dL (ref 8.6–10.2)
Chloride: 101 mmol/L (ref 96–106)
Creatinine, Ser: 1.02 mg/dL (ref 0.76–1.27)
Globulin, Total: 2.2 g/dL (ref 1.5–4.5)
Glucose: 89 mg/dL (ref 70–99)
Potassium: 4.4 mmol/L (ref 3.5–5.2)
Sodium: 138 mmol/L (ref 134–144)
Total Protein: 6.6 g/dL (ref 6.0–8.5)
eGFR: 83 mL/min/1.73

## 2024-12-26 LAB — CBC
Hematocrit: 46.6 % (ref 37.5–51.0)
Hemoglobin: 15.7 g/dL (ref 13.0–17.7)
MCH: 30.6 pg (ref 26.6–33.0)
MCHC: 33.7 g/dL (ref 31.5–35.7)
MCV: 91 fL (ref 79–97)
Platelets: 223 x10E3/uL (ref 150–450)
RBC: 5.13 x10E6/uL (ref 4.14–5.80)
RDW: 12.9 % (ref 11.6–15.4)
WBC: 6.2 x10E3/uL (ref 3.4–10.8)

## 2024-12-26 LAB — LIPID PANEL WITH LDL/HDL RATIO
Cholesterol, Total: 192 mg/dL (ref 100–199)
HDL: 53 mg/dL
LDL Chol Calc (NIH): 125 mg/dL — ABNORMAL HIGH (ref 0–99)
LDL/HDL Ratio: 2.4 ratio (ref 0.0–3.6)
Triglycerides: 77 mg/dL (ref 0–149)
VLDL Cholesterol Cal: 14 mg/dL (ref 5–40)

## 2024-12-26 LAB — PSA: Prostate Specific Ag, Serum: 0.8 ng/mL (ref 0.0–4.0)

## 2024-12-29 ENCOUNTER — Ambulatory Visit: Payer: Self-pay | Admitting: Family Medicine

## 2024-12-29 NOTE — Progress Notes (Signed)
 Hi Anibal, LDL cholesterol went up slightly.  Continue to work on healthy diet and regular exercise.  Metabolic panel including liver and kidney function is normal blood count looks great no sign of anemia.  Prostate test is normal.

## 2025-12-09 ENCOUNTER — Encounter: Admitting: Family Medicine
